# Patient Record
Sex: Female | Born: 1955 | Race: Black or African American | Hispanic: No | Smoking: Current every day smoker
Health system: Southern US, Community
[De-identification: ages and names within clinical notes are randomized; demographics above are authoritative.]

## PROBLEM LIST (undated history)

## (undated) DIAGNOSIS — F329 Major depressive disorder, single episode, unspecified: Secondary | ICD-10-CM

## (undated) DIAGNOSIS — K219 Gastro-esophageal reflux disease without esophagitis: Secondary | ICD-10-CM

## (undated) DIAGNOSIS — F32A Depression, unspecified: Secondary | ICD-10-CM

## (undated) DIAGNOSIS — M1990 Unspecified osteoarthritis, unspecified site: Secondary | ICD-10-CM

## (undated) DIAGNOSIS — E785 Hyperlipidemia, unspecified: Secondary | ICD-10-CM

## (undated) DIAGNOSIS — I10 Essential (primary) hypertension: Secondary | ICD-10-CM

## (undated) DIAGNOSIS — F419 Anxiety disorder, unspecified: Secondary | ICD-10-CM

## (undated) DIAGNOSIS — E119 Type 2 diabetes mellitus without complications: Secondary | ICD-10-CM

## (undated) HISTORY — DX: Anxiety disorder, unspecified: F41.9

## (undated) HISTORY — DX: Essential (primary) hypertension: I10

## (undated) HISTORY — DX: Major depressive disorder, single episode, unspecified: F32.9

## (undated) HISTORY — DX: Hyperlipidemia, unspecified: E78.5

## (undated) HISTORY — DX: Unspecified osteoarthritis, unspecified site: M19.90

## (undated) HISTORY — PX: SKIN GRAFT FULL THICKNESS ARM: SUR1297

## (undated) HISTORY — DX: Depression, unspecified: F32.A

## (undated) HISTORY — DX: Type 2 diabetes mellitus without complications: E11.9

## (undated) HISTORY — DX: Gastro-esophageal reflux disease without esophagitis: K21.9

---

## 2009-02-20 ENCOUNTER — Emergency Department (HOSPITAL_COMMUNITY): Admission: EM | Admit: 2009-02-20 | Discharge: 2009-02-20 | Payer: Self-pay | Admitting: Emergency Medicine

## 2010-04-11 LAB — GLUCOSE, CAPILLARY: Glucose-Capillary: 122 mg/dL — ABNORMAL HIGH (ref 70–99)

## 2015-06-04 LAB — BASIC METABOLIC PANEL: GLUCOSE: 125 mg/dL

## 2016-02-18 DIAGNOSIS — Z23 Encounter for immunization: Secondary | ICD-10-CM | POA: Diagnosis not present

## 2016-04-28 DIAGNOSIS — Z124 Encounter for screening for malignant neoplasm of cervix: Secondary | ICD-10-CM | POA: Diagnosis not present

## 2016-04-28 DIAGNOSIS — Z1231 Encounter for screening mammogram for malignant neoplasm of breast: Secondary | ICD-10-CM | POA: Diagnosis not present

## 2016-04-28 DIAGNOSIS — Z1211 Encounter for screening for malignant neoplasm of colon: Secondary | ICD-10-CM | POA: Diagnosis not present

## 2016-04-28 DIAGNOSIS — E559 Vitamin D deficiency, unspecified: Secondary | ICD-10-CM | POA: Diagnosis not present

## 2016-04-28 DIAGNOSIS — Z6826 Body mass index (BMI) 26.0-26.9, adult: Secondary | ICD-10-CM | POA: Diagnosis not present

## 2016-04-28 DIAGNOSIS — E1165 Type 2 diabetes mellitus with hyperglycemia: Secondary | ICD-10-CM | POA: Diagnosis not present

## 2016-04-28 DIAGNOSIS — R5382 Chronic fatigue, unspecified: Secondary | ICD-10-CM | POA: Diagnosis not present

## 2016-04-28 DIAGNOSIS — I10 Essential (primary) hypertension: Secondary | ICD-10-CM | POA: Diagnosis not present

## 2016-04-28 DIAGNOSIS — Z0000 Encounter for general adult medical examination without abnormal findings: Secondary | ICD-10-CM | POA: Diagnosis not present

## 2016-04-28 DIAGNOSIS — E7801 Familial hypercholesterolemia: Secondary | ICD-10-CM | POA: Diagnosis not present

## 2016-05-26 DIAGNOSIS — M5416 Radiculopathy, lumbar region: Secondary | ICD-10-CM | POA: Diagnosis not present

## 2016-05-26 DIAGNOSIS — M1990 Unspecified osteoarthritis, unspecified site: Secondary | ICD-10-CM | POA: Diagnosis not present

## 2016-05-26 DIAGNOSIS — M5136 Other intervertebral disc degeneration, lumbar region: Secondary | ICD-10-CM | POA: Diagnosis not present

## 2016-05-26 DIAGNOSIS — I999 Unspecified disorder of circulatory system: Secondary | ICD-10-CM | POA: Diagnosis not present

## 2016-09-08 ENCOUNTER — Encounter: Payer: Self-pay | Admitting: Family Medicine

## 2016-09-08 ENCOUNTER — Ambulatory Visit (INDEPENDENT_AMBULATORY_CARE_PROVIDER_SITE_OTHER): Payer: Medicare Other | Admitting: Family Medicine

## 2016-09-08 VITALS — BP 134/78 | HR 88 | Temp 96.2°F | Resp 18 | Ht 63.0 in | Wt 196.0 lb

## 2016-09-08 DIAGNOSIS — E119 Type 2 diabetes mellitus without complications: Secondary | ICD-10-CM

## 2016-09-08 DIAGNOSIS — I10 Essential (primary) hypertension: Secondary | ICD-10-CM

## 2016-09-08 DIAGNOSIS — E559 Vitamin D deficiency, unspecified: Secondary | ICD-10-CM

## 2016-09-08 DIAGNOSIS — E785 Hyperlipidemia, unspecified: Secondary | ICD-10-CM | POA: Diagnosis not present

## 2016-09-08 DIAGNOSIS — G4700 Insomnia, unspecified: Secondary | ICD-10-CM | POA: Diagnosis not present

## 2016-09-08 DIAGNOSIS — M549 Dorsalgia, unspecified: Secondary | ICD-10-CM

## 2016-09-08 DIAGNOSIS — G8929 Other chronic pain: Secondary | ICD-10-CM | POA: Diagnosis not present

## 2016-09-08 DIAGNOSIS — M545 Low back pain: Secondary | ICD-10-CM

## 2016-09-08 DIAGNOSIS — F418 Other specified anxiety disorders: Secondary | ICD-10-CM

## 2016-09-08 DIAGNOSIS — K219 Gastro-esophageal reflux disease without esophagitis: Secondary | ICD-10-CM

## 2016-09-08 DIAGNOSIS — Z9109 Other allergy status, other than to drugs and biological substances: Secondary | ICD-10-CM | POA: Diagnosis not present

## 2016-09-08 LAB — CBC
HCT: 42.3 % (ref 35.0–45.0)
Hemoglobin: 13.8 g/dL (ref 11.7–15.5)
MCH: 31 pg (ref 27.0–33.0)
MCHC: 32.6 g/dL (ref 32.0–36.0)
MCV: 95.1 fL (ref 80.0–100.0)
MPV: 11.4 fL (ref 7.5–12.5)
Platelets: 222 10*3/uL (ref 140–400)
RBC: 4.45 MIL/uL (ref 3.80–5.10)
RDW: 13.1 % (ref 11.0–15.0)
WBC: 4.2 10*3/uL (ref 3.8–10.8)

## 2016-09-08 NOTE — Patient Instructions (Addendum)
Need records Dr Loney Hering Need blood work See me in one month for PE Try to take a walk every day that you are able Need an eye exam
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
## 2016-09-09 LAB — COMPLETE METABOLIC PANEL WITH GFR
ALT: 25 U/L (ref 6–29)
AST: 27 U/L (ref 10–35)
Albumin: 4.1 g/dL (ref 3.6–5.1)
Alkaline Phosphatase: 95 U/L (ref 33–130)
BUN: 15 mg/dL (ref 7–25)
CALCIUM: 9.6 mg/dL (ref 8.6–10.4)
CHLORIDE: 111 mmol/L — AB (ref 98–110)
CO2: 21 mmol/L (ref 20–32)
Creat: 0.93 mg/dL (ref 0.50–0.99)
GFR, EST AFRICAN AMERICAN: 76 mL/min (ref >=60)
GFR, EST NON AFRICAN AMERICAN: 66 mL/min (ref >=60)
Glucose, Bld: 102 mg/dL — ABNORMAL HIGH (ref 65–99)
POTASSIUM: 4.5 mmol/L (ref 3.5–5.3)
Sodium: 141 mmol/L (ref 135–146)
Total Bilirubin: 0.3 mg/dL (ref 0.2–1.2)
Total Protein: 6.6 g/dL (ref 6.1–8.1)

## 2016-09-09 LAB — MICROALBUMIN / CREATININE URINE RATIO
Creatinine, Urine: 166 mg/dL (ref 20–320)
Microalb Creat Ratio: 7 ug/mg{creat} (ref <30)
Microalb, Ur: 1.1 mg/dL

## 2016-09-09 LAB — LIPID PANEL
Cholesterol: 207 mg/dL — ABNORMAL HIGH (ref <200)
HDL: 57 mg/dL (ref >50)
LDL Cholesterol: 114 mg/dL — ABNORMAL HIGH (ref <100)
TRIGLYCERIDES: 179 mg/dL — AB (ref <150)
Total CHOL/HDL Ratio: 3.6 Ratio (ref <5.0)
VLDL: 36 mg/dL — ABNORMAL HIGH (ref <30)

## 2016-09-09 LAB — URINALYSIS, MICROSCOPIC ONLY
Bacteria, UA: NONE SEEN [HPF]
Casts: NONE SEEN [LPF]
Crystals: NONE SEEN [HPF]
Yeast: NONE SEEN [HPF]

## 2016-09-09 LAB — HEMOGLOBIN A1C
Hgb A1c MFr Bld: 5.9 % — ABNORMAL HIGH (ref <5.7)
Mean Plasma Glucose: 123 mg/dL

## 2016-09-09 LAB — URINALYSIS, ROUTINE W REFLEX MICROSCOPIC
Bilirubin Urine: NEGATIVE
Glucose, UA: NEGATIVE
Hgb urine dipstick: NEGATIVE
Ketones, ur: NEGATIVE
Nitrite: NEGATIVE
Protein, ur: NEGATIVE
Specific Gravity, Urine: 1.021 (ref 1.001–1.035)
pH: 6 (ref 5.0–8.0)

## 2016-09-09 LAB — VITAMIN D 25 HYDROXY (VIT D DEFICIENCY, FRACTURES): Vit D, 25-Hydroxy: 28 ng/mL — ABNORMAL LOW (ref 30–100)

## 2016-09-18 ENCOUNTER — Encounter: Payer: Self-pay | Admitting: Family Medicine

## 2016-09-18 MED ORDER — SIMVASTATIN 40 MG PO TABS
40.0000 mg | ORAL_TABLET | Freq: Every day | ORAL | 3 refills | Status: DC
Start: 2016-09-18 — End: 2016-10-10

## 2016-10-10 ENCOUNTER — Ambulatory Visit (INDEPENDENT_AMBULATORY_CARE_PROVIDER_SITE_OTHER): Payer: Medicare Other | Admitting: Family Medicine

## 2016-10-10 ENCOUNTER — Encounter: Payer: Self-pay | Admitting: Family Medicine

## 2016-10-10 VITALS — BP 138/86 | HR 84 | Temp 98.1°F | Resp 18 | Ht 63.0 in | Wt 192.1 lb

## 2016-10-10 DIAGNOSIS — Z1159 Encounter for screening for other viral diseases: Secondary | ICD-10-CM | POA: Diagnosis not present

## 2016-10-10 DIAGNOSIS — Z23 Encounter for immunization: Secondary | ICD-10-CM | POA: Diagnosis not present

## 2016-10-10 DIAGNOSIS — E785 Hyperlipidemia, unspecified: Secondary | ICD-10-CM | POA: Diagnosis not present

## 2016-10-10 DIAGNOSIS — Z1231 Encounter for screening mammogram for malignant neoplasm of breast: Secondary | ICD-10-CM | POA: Diagnosis not present

## 2016-10-10 DIAGNOSIS — M545 Low back pain: Secondary | ICD-10-CM

## 2016-10-10 DIAGNOSIS — G8929 Other chronic pain: Secondary | ICD-10-CM

## 2016-10-10 DIAGNOSIS — E119 Type 2 diabetes mellitus without complications: Secondary | ICD-10-CM | POA: Diagnosis not present

## 2016-10-10 DIAGNOSIS — I10 Essential (primary) hypertension: Secondary | ICD-10-CM

## 2016-10-10 DIAGNOSIS — Z1211 Encounter for screening for malignant neoplasm of colon: Secondary | ICD-10-CM

## 2016-10-10 DIAGNOSIS — Z0000 Encounter for general adult medical examination without abnormal findings: Secondary | ICD-10-CM

## 2016-10-10 DIAGNOSIS — Z1239 Encounter for other screening for malignant neoplasm of breast: Secondary | ICD-10-CM

## 2016-10-10 MED ORDER — LISINOPRIL 5 MG PO TABS: 5.0000 mg | ORAL_TABLET | Freq: Every day | ORAL | 11 refills | Status: DC

## 2016-10-10 MED ORDER — METFORMIN HCL 500 MG PO TABS: 500.0000 mg | ORAL_TABLET | Freq: Two times a day (BID) | ORAL | 11 refills | Status: AC

## 2016-10-10 MED ORDER — SIMVASTATIN 40 MG PO TABS: 40.0000 mg | ORAL_TABLET | Freq: Every day | ORAL | 11 refills | Status: AC

## 2016-10-10 MED ORDER — BUSPIRONE HCL 5 MG PO TABS: 5.0000 mg | ORAL_TABLET | Freq: Three times a day (TID) | ORAL | 11 refills | Status: AC

## 2016-10-10 MED ORDER — RANITIDINE HCL 150 MG PO TABS: 150.0000 mg | ORAL_TABLET | Freq: Two times a day (BID) | ORAL | 11 refills | Status: DC

## 2016-10-10 MED ORDER — VENLAFAXINE HCL ER 75 MG PO CP24: 75.0000 mg | ORAL_CAPSULE | Freq: Every day | ORAL | 11 refills | Status: AC

## 2016-10-10 MED ORDER — AMITRIPTYLINE HCL 25 MG PO TABS: 25.0000 mg | ORAL_TABLET | Freq: Every day | ORAL | 11 refills | Status: DC

## 2016-10-10 MED ORDER — ASPIRIN EC 81 MG PO TBEC: 81.0000 mg | DELAYED_RELEASE_TABLET | Freq: Every day | ORAL | 11 refills | Status: AC

## 2016-10-10 MED ORDER — GABAPENTIN 300 MG PO CAPS
300.0000 mg | ORAL_CAPSULE | Freq: Three times a day (TID) | ORAL | 11 refills | Status: DC
Start: 2016-10-10 — End: 2018-03-15

## 2016-10-10 MED ORDER — NAPROXEN 500 MG PO TABS: 500.0000 mg | ORAL_TABLET | Freq: Two times a day (BID) | ORAL | 11 refills | Status: DC

## 2016-10-10 MED ORDER — CETIRIZINE HCL 10 MG PO TABS: 10.0000 mg | ORAL_TABLET | Freq: Every day | ORAL | 11 refills | Status: AC

## 2016-10-10 NOTE — Patient Instructions (Signed)
Need mammogram Need back x ray Need colon cancer screening - referred to gastroenterology Medicines refilled Stay as active as you can manage Get blood work I will notify you of your test results  See me every 3 months

## 2016-10-10 NOTE — Progress Notes (Signed)
Chief Complaint  Patient presents with  . Annual Exam   Here for PE Never had mammogram Never had colonoscopy Last PAP one year BP controlled DM controlled Flu shot today Needs diabetes eye exam  Patient Active Problem List   Diagnosis Date Noted  . Controlled type 2 diabetes mellitus without complication, without long-term current use of insulin (HCC) 09/08/2016  . HLD (hyperlipidemia) 09/08/2016  . Essential hypertension 09/08/2016  . Environmental allergies 09/08/2016  . GERD without esophagitis 09/08/2016  . Chronic back pain 09/08/2016  . Depression with anxiety 09/08/2016  . Insomnia 09/08/2016    Outpatient Encounter Prescriptions as of 10/10/2016  Medication Sig  . amitriptyline (ELAVIL) 25 MG tablet Take 1 tablet (25 mg total) by mouth at bedtime.  Marland Kitchen aspirin EC 81 MG tablet Take 1 tablet (81 mg total) by mouth daily.  . busPIRone (BUSPAR) 5 MG tablet Take 1 tablet (5 mg total) by mouth 3 (three) times daily.  . cetirizine (ZYRTEC) 10 MG tablet Take 1 tablet (10 mg total) by mouth daily.  Marland Kitchen gabapentin (NEURONTIN) 300 MG capsule Take 1 capsule (300 mg total) by mouth 3 (three) times daily.  Marland Kitchen lisinopril (PRINIVIL,ZESTRIL) 5 MG tablet Take 1 tablet (5 mg total) by mouth daily.  . metFORMIN (GLUCOPHAGE) 500 MG tablet Take 1 tablet (500 mg total) by mouth 2 (two) times daily with a meal.  . naproxen (NAPROSYN) 500 MG tablet Take 1 tablet (500 mg total) by mouth 2 (two) times daily with a meal.  . ranitidine (ZANTAC) 150 MG tablet Take 1 tablet (150 mg total) by mouth 2 (two) times daily.  . simvastatin (ZOCOR) 40 MG tablet Take 1 tablet (40 mg total) by mouth daily.  Marland Kitchen venlafaxine XR (EFFEXOR-XR) 75 MG 24 hr capsule Take 1 capsule (75 mg total) by mouth daily with breakfast.   No facility-administered encounter medications on file as of 10/10/2016.     No Known Allergies  Review of Systems  Constitutional: Negative for activity change, appetite change and  unexpected weight change.  HENT: Negative for congestion, dental problem, postnasal drip and rhinorrhea.   Eyes: Negative for redness and visual disturbance.  Respiratory: Negative for cough and shortness of breath.   Cardiovascular: Negative for chest pain, palpitations and leg swelling.  Gastrointestinal: Negative for abdominal pain, constipation and diarrhea.  Genitourinary: Negative for difficulty urinating, frequency and menstrual problem.  Musculoskeletal: Positive for back pain. Negative for arthralgias.       Back and both legs  Neurological: Negative for dizziness and headaches.  Psychiatric/Behavioral: Negative for dysphoric mood and sleep disturbance. The patient is not nervous/anxious.     BP 138/86 (BP Location: Right Arm, Patient Position: Supine)   Pulse 84   Temp 98.1 F (36.7 C) (Temporal)   Resp 18   Ht  (1.6 m)   Wt 192 lb 1.9 oz (87.1 kg)   SpO2 96%   BMI 34.03 kg/m   Physical Exam  BP 138/86 (BP Location: Right Arm, Patient Position: Supine)   Pulse 84   Temp 98.1 F (36.7 C) (Temporal)   Resp 18   Ht  (1.6 m)   Wt 192 lb 1.9 oz (87.1 kg)   SpO2 96%   BMI 34.03 kg/m   General Appearance:    Alert, cooperative, no distress, appears stated age  Head:    Normocephalic, without obvious abnormality, atraumatic  Eyes:    PERRL, conjunctiva/corneas clear, EOM's intact, fundi    benign, both  eyes  Ears:    Normal TM's and external ear canals, both ears  Nose:   Nares normal, septum midline, mucosa normal, no drainage    or sinus tenderness  Throat:   Lips, mucosa, and tongue normal; teeth mostly absent or fractured at gum line   Neck:   Supple, symmetrical, trachea midline, no adenopathy;    thyroid:  no enlargement/tenderness/nodules; no carotid   bruit  Back:     Symmetric, no curvature, ROM normal, no tenderness  Lungs:     Clear to auscultation bilaterally, respirations unlabored  Chest Wall:    No tenderness or deformity   Heart:     Regular rate and rhythm, S1 and S2 normal, no murmur, rub   or gallop  Breast Exam:    No tenderness, masses, or nipple abnormality.  Axilla clear  Abdomen:     Soft, non-tender, bowel sounds active all four quadrants,    no masses, no organomegaly  Genitalia:    Normal female without lesion, discharge or tenderness.  Strong odor  Extremities:   Extremities normal, atraumatic, no cyanosis or edema  Pulses:   2+ and symmetric all extremities  Skin:   Skin color, texture, turgor normal, no rashes or lesions.  Thickened dystrophic toenails bilat  Lymph nodes:   Cervical, supraclavicular, and axillary nodes normal  Neurologic:   Normal strength, sensation and reflexes    throughout     ASSESSMENT/PLAN:  1. Need for influenza vaccination - Flu Vaccine QUAD 36+ mos IM  2. Screen for colon cancer - Ambulatory referral to Gastroenterology  3. Encounter for hepatitis C screening test for low risk patient - Hepatitis C antibody  4. Chronic midline low back pain without sciatica  5. Controlled type 2 diabetes mellitus without complication, without long-term current use of insulin (HCC) - CBC - COMPLETE METABOLIC PANEL WITH GFR - Hemoglobin A1c - Lipid panel - Microalbumin / creatinine urine ratio - Urinalysis, Routine w reflex microscopic - DG Lumbar Spine Complete; Future - Ambulatory referral to Ophthalmology  6. Hyperlipidemia, unspecified hyperlipidemia type  7. Essential hypertension  8. Screening for breast cancer - MM Digital Screening; Future   Patient Instructions  Need mammogram Need back x ray Need colon cancer screening - referred to gastroenterology Medicines refilled Stay as active as you can manage Get blood work I will notify you of your test results  See me every 3 months   Eustace Moore, MD

## 2016-10-17 ENCOUNTER — Telehealth: Payer: Self-pay | Admitting: Family Medicine

## 2016-10-17 NOTE — Telephone Encounter (Signed)
Left message informing patient that the lumbar xray wont do the legs but the pain could be coming from the back and radiating to the legs that is why the xray is of the lumbar spine.

## 2016-10-17 NOTE — Telephone Encounter (Signed)
Patient states she is scheduled for a lumber xray @ [facility].  She would like to know if the xrays will include both legs. If they are not included, she is asking if this could be added.   cb [phone] [id number]

## 2016-10-18 ENCOUNTER — Telehealth: Payer: Self-pay | Admitting: Family Medicine

## 2016-10-18 NOTE — Telephone Encounter (Signed)
Patients states that her arms are bothering her and would like to know if xrays of the arms can be done when the back xrays are done tomorrow @ [facility].

## 2016-10-19 ENCOUNTER — Ambulatory Visit (HOSPITAL_COMMUNITY): Payer: Medicare Other

## 2016-10-19 NOTE — Telephone Encounter (Signed)
Patient informed of message below, verbalized understanding.  

## 2016-10-19 NOTE — Telephone Encounter (Signed)
Unfortunately, that requires another visit and evaluation.  Cannot x ray "arms"

## 2016-10-26 ENCOUNTER — Ambulatory Visit (HOSPITAL_COMMUNITY): Payer: Medicare Other

## 2016-11-02 ENCOUNTER — Ambulatory Visit (HOSPITAL_COMMUNITY): Payer: Medicare Other

## 2016-11-14 ENCOUNTER — Other Ambulatory Visit: Payer: Self-pay | Admitting: Family Medicine

## 2016-11-14 NOTE — Telephone Encounter (Signed)
Seen [phone] 

## 2016-12-05 ENCOUNTER — Other Ambulatory Visit: Payer: Self-pay | Admitting: Family Medicine

## 2016-12-06 ENCOUNTER — Other Ambulatory Visit: Payer: Self-pay | Admitting: Family Medicine

## 2017-01-04 ENCOUNTER — Ambulatory Visit (INDEPENDENT_AMBULATORY_CARE_PROVIDER_SITE_OTHER): Payer: Medicare Other | Admitting: Family Medicine

## 2017-01-04 ENCOUNTER — Other Ambulatory Visit: Payer: Self-pay

## 2017-01-04 ENCOUNTER — Encounter: Payer: Self-pay | Admitting: Family Medicine

## 2017-01-04 VITALS — BP 162/100 | HR 88 | Temp 98.2°F | Resp 18 | Ht 63.0 in | Wt 198.0 lb

## 2017-01-04 DIAGNOSIS — I10 Essential (primary) hypertension: Secondary | ICD-10-CM

## 2017-01-04 DIAGNOSIS — E785 Hyperlipidemia, unspecified: Secondary | ICD-10-CM

## 2017-01-04 DIAGNOSIS — E119 Type 2 diabetes mellitus without complications: Secondary | ICD-10-CM

## 2017-01-04 DIAGNOSIS — M545 Low back pain: Secondary | ICD-10-CM | POA: Diagnosis not present

## 2017-01-04 DIAGNOSIS — G8929 Other chronic pain: Secondary | ICD-10-CM

## 2017-01-04 MED ORDER — LISINOPRIL 10 MG PO TABS: 10.0000 mg | ORAL_TABLET | Freq: Every day | ORAL | 3 refills | Status: DC

## 2017-01-04 MED ORDER — BLOOD GLUCOSE MONITOR KIT: PACK | 0 refills | Status: DC

## 2017-01-04 NOTE — Patient Instructions (Addendum)
You can go get the back x ray next week Due for blood work today  Need to increase the lisinopril to 10 mg a day I have sent a new prescription to [facility]  See me in 2 weeks

## 2017-01-04 NOTE — Progress Notes (Signed)
Chief Complaint  Patient presents with  . Follow-up    3 month   Patient is here for follow-up.  She has not had the testing that was recommended to order for her since her last visit.  She states that she had a sick family member and has been out of town for the last 3 months. She is overdue for mammogram, Pap smear, colonoscopy, foot exam, diabetic eye exam, and immunizations.  She did get her flu shot today. I reviewed the notes from her old primary care doctor, and was able to abstract due to pneumonia shots.  Other than this the results were noncontributory. She continues to complain of low back pain.  This is been a complaint for many months, even in her old family practice doctor's note.  She states that it sometimes radiates into both legs.  She is requesting an x-ray.  This was ordered for her in September not obtained.  It is reordered for her today.  She states that she takes Tylenol but is not helpful for her.  She has no bowel or bladder complaints.  No numbness or weakness.  No history of back injury or surgery. She has diabetes.  She states her sugars are well controlled.  She states she needs a new monitor.  Her sugar this morning was 160.  Her last hemoglobin A1c was under 6.  We will repeat lab work for her today. She is compliant with her blood pressure medication.  She takes lisinopril 5 mg a day.  In spite of this her blood pressure is quite elevated today.  I am going to increase her lisinopril to 10 mg a day and will recheck it in a couple weeks. She is compliant with her statin.  Her last LDL was elevated at 114.  If it is elevated again today with current blood work I will increase her statin.  No history of cardiovascular disease, heart disease, PVD.  She does not smoke.  Patient Active Problem List   Diagnosis Date Noted  . Controlled type 2 diabetes mellitus without complication, without long-term current use of insulin ([place]) 09/08/2016  . HLD (hyperlipidemia)  09/08/2016  . Essential hypertension 09/08/2016  . Environmental allergies 09/08/2016  . GERD without esophagitis 09/08/2016  . Chronic back pain 09/08/2016  . Depression with anxiety 09/08/2016  . Insomnia 09/08/2016    Outpatient Encounter Medications as of 01/04/2017  Medication Sig  . amitriptyline (ELAVIL) 25 MG tablet Take 1 tablet (25 mg total) by mouth at bedtime.  Marland Kitchen aspirin EC 81 MG tablet Take 1 tablet (81 mg total) by mouth daily.  . busPIRone (BUSPAR) 5 MG tablet Take 1 tablet (5 mg total) by mouth 3 (three) times daily.  . cetirizine (ZYRTEC) 10 MG tablet Take 1 tablet (10 mg total) by mouth daily.  Marland Kitchen gabapentin (NEURONTIN) 300 MG capsule Take 1 capsule (300 mg total) by mouth 3 (three) times daily.  . metFORMIN (GLUCOPHAGE) 500 MG tablet Take 1 tablet (500 mg total) by mouth 2 (two) times daily with a meal.  . naproxen (NAPROSYN) 500 MG tablet Take 1 tablet (500 mg total) by mouth 2 (two) times daily with a meal.  . ranitidine (ZANTAC) 150 MG tablet TAKE 1 TABLET BY MOUTH TWICE DAILY.  . simvastatin (ZOCOR) 40 MG tablet Take 1 tablet (40 mg total) by mouth daily.  Marland Kitchen venlafaxine XR (EFFEXOR-XR) 75 MG 24 hr capsule Take 1 capsule (75 mg total) by mouth daily with breakfast.  .  blood glucose meter kit and supplies KIT Dispense based on patient and insurance preference.  Marland Kitchen lisinopril (PRINIVIL,ZESTRIL) 10 MG tablet Take 1 tablet (10 mg total) by mouth daily.   No facility-administered encounter medications on file as of 01/04/2017.     No Known Allergies  Review of Systems  Constitutional: Negative for activity change, appetite change and unexpected weight change.  HENT: Positive for dental problem. Negative for congestion, postnasal drip and rhinorrhea.        No dental care, dental fractures  Eyes: Positive for visual disturbance. Negative for redness.       Overdue for eye exam  Respiratory: Negative for cough and shortness of breath.   Cardiovascular: Negative for  chest pain, palpitations and leg swelling.  Gastrointestinal: Negative for abdominal pain, constipation and diarrhea.  Genitourinary: Negative for difficulty urinating, frequency and menstrual problem.  Musculoskeletal: Positive for back pain. Negative for arthralgias.       Back and both legs  Neurological: Negative for dizziness and headaches.  Psychiatric/Behavioral: Negative for dysphoric mood and sleep disturbance. The patient is not nervous/anxious.     BP (!) 162/100   Pulse 88   Temp 98.2 F (36.8 C) (Temporal)   Resp 18   Ht _0  (1.6 m)   Wt 198 lb 0.6 oz (89.8 kg)   SpO2 99%   BMI 35.08 kg/m   Physical Exam  Constitutional: She is oriented to person, place, and time. She appears well-developed and well-nourished.  HENT:  Head: Normocephalic and atraumatic.  Mouth/Throat: Oropharynx is clear and moist.  Eyes: Conjunctivae are normal. Pupils are equal, round, and reactive to light.  Neck: Normal range of motion. No thyromegaly present.  Cardiovascular: Normal rate, regular rhythm and normal heart sounds.  Pulmonary/Chest: Effort normal and breath sounds normal.  Lymphadenopathy:    She has no cervical adenopathy.  Neurological: She is alert and oriented to person, place, and time. She displays normal reflexes.  Psychiatric: She has a normal mood and affect. Her behavior is normal.    ASSESSMENT/PLAN:  1. Chronic midline low back pain without sciatica - DG Lumbar Spine Complete; Future  2. Controlled type 2 diabetes mellitus without complication, without long-term current use of insulin ([place]) - Ambulatory referral to Podiatry  3. Hyperlipidemia, unspecified hyperlipidemia type  4. Essential hypertension Not well controlled.  We will double her lisinopril and follow.   Patient Instructions  You can go get the back x ray next week Due for blood work today  Need to increase the lisinopril to 10 mg a day I have sent a new prescription to [facility]  See me in  2 weeks    Raylene Everts, MD

## 2017-01-05 ENCOUNTER — Ambulatory Visit: Payer: Medicare Other | Admitting: Family Medicine

## 2017-01-08 ENCOUNTER — Ambulatory Visit (HOSPITAL_COMMUNITY)
Admission: RE | Admit: 2017-01-08 | Discharge: 2017-01-08 | Disposition: A | Discharge status: Home / Self Care | Payer: Medicare Other | Source: Ambulatory Visit | Attending: Family Medicine | Admitting: Family Medicine

## 2017-01-08 DIAGNOSIS — M4316 Spondylolisthesis, lumbar region: Secondary | ICD-10-CM | POA: Insufficient documentation

## 2017-01-08 DIAGNOSIS — M545 Low back pain: Secondary | ICD-10-CM | POA: Insufficient documentation

## 2017-01-08 DIAGNOSIS — M5136 Other intervertebral disc degeneration, lumbar region: Secondary | ICD-10-CM | POA: Diagnosis not present

## 2017-01-08 DIAGNOSIS — G8929 Other chronic pain: Secondary | ICD-10-CM | POA: Diagnosis not present

## 2017-01-09 ENCOUNTER — Ambulatory Visit: Payer: Medicare Other | Admitting: Family Medicine

## 2017-01-18 ENCOUNTER — Encounter: Payer: Self-pay | Admitting: Family Medicine

## 2017-01-22 ENCOUNTER — Other Ambulatory Visit: Payer: Self-pay

## 2017-01-22 ENCOUNTER — Telehealth: Payer: Self-pay | Admitting: Family Medicine

## 2017-01-22 ENCOUNTER — Encounter: Payer: Self-pay | Admitting: Family Medicine

## 2017-01-22 ENCOUNTER — Ambulatory Visit (INDEPENDENT_AMBULATORY_CARE_PROVIDER_SITE_OTHER): Payer: Medicare Other | Admitting: Family Medicine

## 2017-01-22 VITALS — BP 164/110 | HR 88 | Temp 97.8°F | Resp 18 | Ht 63.0 in | Wt 199.0 lb

## 2017-01-22 DIAGNOSIS — E119 Type 2 diabetes mellitus without complications: Secondary | ICD-10-CM | POA: Diagnosis not present

## 2017-01-22 DIAGNOSIS — I10 Essential (primary) hypertension: Secondary | ICD-10-CM

## 2017-01-22 DIAGNOSIS — M545 Low back pain, unspecified: Secondary | ICD-10-CM

## 2017-01-22 DIAGNOSIS — G8929 Other chronic pain: Secondary | ICD-10-CM | POA: Diagnosis not present

## 2017-01-22 NOTE — Patient Instructions (Addendum)
Take the medicines every day I am referring you to a orthopedic for the back pain Walk every day that you are able Do back exercises twice a day See me in a month The next appt should be in the afternoon Need referral to the eye doctor for DIABETIC EYE EXAM

## 2017-01-22 NOTE — Telephone Encounter (Signed)
Wants to know if you was calling in some more medicine, please call her [phone]

## 2017-01-22 NOTE — Progress Notes (Signed)
Chief Complaint  Patient presents with  . Follow-up    2 week   Patient is here for routine follow-up.  She continues to complain of daily back pain.  She wants to see a back specialist.  I will refer her to orthopedic.  I discussed with her that she needs to lose weight.  I discussed with her she needs to walk daily.  I have given her back exercises to do.  She can take over-the-counter Advil or Aleve for pain.  She feels like her back is not getting better.  I did do an x-ray of her back.  Other than multilevel degenerative disc changes that are mild and some arthritic change, nothing appears significant.  Medicaid will not pay for physical therapy.  We will get a back specialist to see if there are other recommendations. She also is here for a blood pressure check.  At her last visit I increased her lisinopril.  Unfortunately she is still taking the old dose and even more unfortunately, she did not take her medicine this morning.  Her blood pressure is not well controlled.  I discussed with her the appropriate doses of medicines.  I checked her blister packs.  I advised her to come back for follow-up in the afternoon when she is appropriately taken her medications. We discussed again that she is overdue for a eye exam.  She states insurance does not pay for it.  I will place a referral for a diabetic eye exam.  Patient Active Problem List   Diagnosis Date Noted  . Controlled type 2 diabetes mellitus without complication, without long-term current use of insulin ([place]) 09/08/2016  . HLD (hyperlipidemia) 09/08/2016  . Essential hypertension 09/08/2016  . Environmental allergies 09/08/2016  . GERD without esophagitis 09/08/2016  . Chronic back pain 09/08/2016  . Depression with anxiety 09/08/2016  . Insomnia 09/08/2016    Outpatient Encounter Medications as of 01/22/2017  Medication Sig  . amitriptyline (ELAVIL) 25 MG tablet Take 1 tablet (25 mg total) by mouth at bedtime.  Marland Kitchen aspirin EC  81 MG tablet Take 1 tablet (81 mg total) by mouth daily.  . blood glucose meter kit and supplies KIT Dispense based on patient and insurance preference.  . busPIRone (BUSPAR) 5 MG tablet Take 1 tablet (5 mg total) by mouth 3 (three) times daily.  . cetirizine (ZYRTEC) 10 MG tablet Take 1 tablet (10 mg total) by mouth daily.  Marland Kitchen gabapentin (NEURONTIN) 300 MG capsule Take 1 capsule (300 mg total) by mouth 3 (three) times daily.  Marland Kitchen lisinopril (PRINIVIL,ZESTRIL) 10 MG tablet Take 1 tablet (10 mg total) by mouth daily.  . metFORMIN (GLUCOPHAGE) 500 MG tablet Take 1 tablet (500 mg total) by mouth 2 (two) times daily with a meal.  . naproxen (NAPROSYN) 500 MG tablet Take 1 tablet (500 mg total) by mouth 2 (two) times daily with a meal.  . ranitidine (ZANTAC) 150 MG tablet TAKE 1 TABLET BY MOUTH TWICE DAILY.  . simvastatin (ZOCOR) 40 MG tablet Take 1 tablet (40 mg total) by mouth daily.  Marland Kitchen venlafaxine XR (EFFEXOR-XR) 75 MG 24 hr capsule Take 1 capsule (75 mg total) by mouth daily with breakfast.   No facility-administered encounter medications on file as of 01/22/2017.     No Known Allergies  Review of Systems  Constitutional: Negative for activity change, appetite change and unexpected weight change.  HENT: Positive for dental problem. Negative for congestion, postnasal drip and rhinorrhea.  No dental care, dental fractures  Eyes: Positive for visual disturbance. Negative for redness.       Overdue for eye exam  Respiratory: Negative for cough and shortness of breath.   Cardiovascular: Negative for chest pain, palpitations and leg swelling.  Gastrointestinal: Negative for abdominal pain, constipation and diarrhea.  Genitourinary: Negative for difficulty urinating, frequency and menstrual problem.  Musculoskeletal: Positive for back pain. Negative for arthralgias.       Back pain chronic  Neurological: Negative for dizziness and headaches.  Psychiatric/Behavioral: Negative for dysphoric  mood and sleep disturbance. The patient is not nervous/anxious.     BP (!) 164/110 (BP Location: Left Arm, Patient Position: Sitting, Cuff Size: Normal)   Pulse 88   Temp 97.8 F (36.6 C) (Temporal)   Resp 18   Ht 5' 3"  (1.6 m)   Wt 199 lb 0.6 oz (90.3 kg)   SpO2 97%   BMI 35.26 kg/m   Physical Exam  Constitutional: She is oriented to person, place, and time. She appears well-developed and well-nourished.  HENT:  Head: Normocephalic and atraumatic.  Mouth/Throat: Oropharynx is clear and moist.  Eyes: Conjunctivae are normal. Pupils are equal, round, and reactive to light.  Neck: Normal range of motion.  Cardiovascular: Normal rate, regular rhythm and normal heart sounds.  Pulmonary/Chest: Effort normal and breath sounds normal.  Musculoskeletal: Normal range of motion.  Back appears straight and symmetric.  No tenderness over the lumbar muscles.  Mild tenderness centrally over L4-5.  No SI tenderness.  Strength sensation range of motion and reflexes are symmetric in both lower extremities with no focal neurologic findings  Neurological: She is alert and oriented to person, place, and time. She displays normal reflexes.  Psychiatric: She has a normal mood and affect. Her behavior is normal.    ASSESSMENT/PLAN:  1. Chronic midline low back pain without sciatica Patient has failed Naprosyn.  I doubt she is compliant with walking and exercise.  She wishes to see an orthopedic surgeon so this is arranged.  Back x-ray with degenerative findings and minor spondylolisthesis. - Ambulatory referral to Orthopedic Surgery  2. Essential hypertension Not well controlled.  Not taking medications.  3. Controlled type 2 diabetes mellitus without complication, without long-term current use of insulin [facility]) Referral placed - Ambulatory referral to Ophthalmology   Patient Instructions  Take the medicines every day I am referring you to a orthopedic for the back pain Walk every day that you  are able Do back exercises twice a day See me in a month The next appt should be in the afternoon Need referral to the eye doctor for DIABETIC EYE EXAM   Raylene Everts, MD

## 2017-01-24 ENCOUNTER — Telehealth: Payer: Self-pay

## 2017-01-24 NOTE — Telephone Encounter (Signed)
Pt called and said she should have received BP med and pain med for her back and legs after her appt on Monday 12/31.  She said noting was ever called in.  Please check on this and call her back at [phone].  Thank You

## 2017-01-24 NOTE — Telephone Encounter (Signed)
Called and spoke to Leah King. Advised her we did rx her new dose of lisinopril on 12 13 18, and she needs to check with her pharmacy. Also aware of dr Joanie Coddingtonnelsons advice to take aleve or advil for back pain, and an ortho referral was placed.

## 2017-01-30 ENCOUNTER — Telehealth: Payer: Self-pay | Admitting: Family Medicine

## 2017-01-30 NOTE — Telephone Encounter (Signed)
Has she seen the orthopedic surgeon?  She can take over-the-counter pain medication until she sees the specialist

## 2017-01-30 NOTE — Telephone Encounter (Signed)
Pt called in left a msg that she went to the Dr to get the Xray like you advised, and her leg and back are hurting worse, she needs something for pain.Marland Kitchen. Please call

## 2017-01-30 NOTE — Telephone Encounter (Signed)
Patient left message regarding back and legs- states walking hurts, has had x-rays, wants medication.

## 2017-01-30 NOTE — Telephone Encounter (Signed)
At her last visit I referred her to an orthopedic surgeon

## 2017-01-31 NOTE — Telephone Encounter (Signed)
I have attempted to call Breella, no answer, no answering machine.

## 2017-02-01 NOTE — Telephone Encounter (Signed)
I advised the patient to continue with OTC Pain Medicine per Dr Joanie CoddingtonNelsons note, also advised that she can call Dr Diamantina [facility]Harrisons office for an Appt.

## 2017-02-22 ENCOUNTER — Ambulatory Visit: Payer: Medicare Other | Admitting: Family Medicine

## 2017-02-28 ENCOUNTER — Encounter: Payer: Self-pay | Admitting: Orthopedic Surgery

## 2017-02-28 ENCOUNTER — Ambulatory Visit (INDEPENDENT_AMBULATORY_CARE_PROVIDER_SITE_OTHER): Payer: Medicare Other | Admitting: Orthopedic Surgery

## 2017-02-28 VITALS — BP 146/96 | HR 96 | Ht 63.0 in | Wt 196.0 lb

## 2017-02-28 DIAGNOSIS — M5136 Other intervertebral disc degeneration, lumbar region: Secondary | ICD-10-CM

## 2017-02-28 NOTE — Patient Instructions (Addendum)
Physical therapy has been ordered for you at [facility] [phone] is the phone number to call if you want to call to schedule. Please let us know if you do not hear anything within one week.   Degenerative Disk Disease Degenerative disk disease is a condition caused by the changes that occur in spinal disks as you grow older. Spinal disks are soft and compressible disks located between the bones of your spine (vertebrae). These disks act like shock absorbers. Degenerative disk disease can affect the whole spine. However, the neck and lower back are most commonly affected. Many changes can occur in the spinal disks with aging, such as:  The spinal disks may dry and shrink.  Small tears may occur in the tough, outer covering of the disk (annulus).  The disk space may become smaller due to loss of water.  Abnormal growths in the bone (spurs) may occur. This can put pressure on the nerve roots exiting the spinal canal, causing pain.  The spinal canal may become narrowed.  What increases the risk?  Being overweight.  Having a family history of degenerative disk disease.  Smoking.  There is increased risk if you are doing heavy lifting or have a sudden injury. What are the signs or symptoms? Symptoms vary from person to person and may include:  Pain that varies in intensity. Some people have no pain, while others have severe pain. The location of the pain depends on the part of your backbone that is affected. ? You will have neck or arm pain if a disk in the neck area is affected. ? You will have pain in your back, buttocks, or legs if a disk in the lower back is affected.  Pain that becomes worse while bending, reaching up, or with twisting movements.  Pain that may start gradually and then get worse as time passes. It may also start after a major or minor injury.  Numbness or tingling in the arms or legs.  How is this diagnosed? Your health care provider will ask you about your  symptoms and about activities or habits that may cause the pain. He or she may also ask about any injuries, diseases, or treatments you have had. Your health care provider will examine you to check for the range of movement that is possible in the affected area, to check for strength in your extremities, and to check for sensation in the areas of the arms and legs supplied by different nerve roots. You may also have:  An X-ray of the spine.  Other imaging tests, such as MRI.  How is this treated? Your health care provider will advise you on the best plan for treatment. Treatment may include:  Medicines.  Rehabilitation exercises.  Follow these instructions at home:  Follow proper lifting and walking techniques as advised by your health care provider.  Maintain good posture.  Exercise regularly as advised by your health care provider.  Perform relaxation exercises.  Change your sitting, standing, and sleeping habits as advised by your health care provider.  Change positions frequently.  Lose weight or maintain a healthy weight as advised by your health care provider.  Do not use any tobacco products, including cigarettes, chewing tobacco, or electronic cigarettes. If you need help quitting, ask your health care provider.  Wear supportive footwear.  Take medicines only as directed by your health care provider. Contact a health care provider if:  Your pain does not go away within 1-4 weeks.  You   have significant appetite or weight loss. Get help right away if:  Your pain is severe.  You notice weakness in your arms, hands, or legs.  You begin to lose control of your bladder or bowel movements.  You have fevers or night sweats. This information is not intended to replace advice given to you by your health care provider. Make sure you discuss any questions you have with your health care provider. Document Released: 11/06/2006 Document Revised: 06/17/2015 Document Reviewed:  05/13/2013 Elsevier Interactive Patient Education  2018 Elsevier Inc.  

## 2017-02-28 NOTE — Progress Notes (Signed)
Patient ID: Analy King, female   DOB: Feb 25, 1955, 62 y.o.   MRN: [id number]  Back pain back pain times 3 years  HPI Leah King is a 62 y.o. female. 62 years old presents with 3-year history of back pain  She has not had any treatment although she is on Elavil gabapentin and naproxen for other reasons.  She says he had arthritis and was treated for that but her doctor passed away  She is referred to [place] from Dr. Meda King x-rays show degenerative disc disease throughout the lumbar spine  She has bilateral leg pain which is constant moderate in severity dull aching pain shoulder pain arm pain and again of course lower back pain no injury or trauma no prior treatment   Review of Systems Review of Systems  Gastrointestinal: Negative.   Genitourinary: Negative.   Musculoskeletal: Positive for back pain, joint swelling and myalgias.  Neurological: Negative for weakness and numbness.    Past Medical History:  Diagnosis Date  . Anxiety   . Arthritis    back and legs  . Depression   . Diabetes mellitus without complication ([place])   . GERD (gastroesophageal reflux disease)   . Hyperlipidemia   . Hypertension     Past Surgical History:  Procedure Laterality Date  . SKIN GRAFT FULL THICKNESS ARM      Family History  Problem Relation Age of Onset  . Alcohol abuse Mother   . Heart disease Father   . Hypertension Son   . Hypertension Daughter   . Diabetes Daughter   . Alzheimer's disease Maternal Grandmother   . Diabetes Maternal Grandfather    was reviewed  Social History Social History   Tobacco Use  . Smoking status: Current Every Day Smoker    Packs/day: 0.25    Types: Cigarettes    Start date: 01/23/1990  . Smokeless tobacco: Never Used  . Tobacco comment: 2 cig a day  Substance Use Topics  . Alcohol use: No  . Drug use: No    No Known Allergies  Current Outpatient Medications  Medication Sig Dispense Refill  . amitriptyline (ELAVIL) 25 MG tablet Take 1 tablet  (25 mg total) by mouth at bedtime. 30 tablet 11  . aspirin EC 81 MG tablet Take 1 tablet (81 mg total) by mouth daily. 30 tablet 11  . blood glucose meter kit and supplies KIT Dispense based on patient and insurance preference. 1 each 0  . busPIRone (BUSPAR) 5 MG tablet Take 1 tablet (5 mg total) by mouth 3 (three) times daily. 90 tablet 11  . cetirizine (ZYRTEC) 10 MG tablet Take 1 tablet (10 mg total) by mouth daily. 30 tablet 11  . gabapentin (NEURONTIN) 300 MG capsule Take 1 capsule (300 mg total) by mouth 3 (three) times daily. 90 capsule 11  . lisinopril (PRINIVIL,ZESTRIL) 10 MG tablet Take 1 tablet (10 mg total) by mouth daily. 90 tablet 3  . metFORMIN (GLUCOPHAGE) 500 MG tablet Take 1 tablet (500 mg total) by mouth 2 (two) times daily with a meal. 60 tablet 11  . naproxen (NAPROSYN) 500 MG tablet Take 1 tablet (500 mg total) by mouth 2 (two) times daily with a meal. 60 tablet 11  . ranitidine (ZANTAC) 150 MG tablet TAKE 1 TABLET BY MOUTH TWICE DAILY. 180 tablet 3  . simvastatin (ZOCOR) 40 MG tablet Take 1 tablet (40 mg total) by mouth daily. 30 tablet 11  . venlafaxine XR (EFFEXOR-XR) 75 MG 24 hr capsule Take 1 capsule (75  mg total) by mouth daily with breakfast. 30 capsule 11   No current facility-administered medications for this visit.        Physical Exam There were no vitals taken for this visit. Physical Exam Gen. appearance: The patient is well-developed and well-nourished grooming and hygiene are normal The patient is oriented to person place and time The patient's mood is normal and the affect is normal  Ortho Exam Gait assessment: The patient stands with normal gait and station  Lumbar spine Tenderness  to palpation is noted in the lower L4-5 and 5 S1 segment  Range of motion flexion extension decreased in both directions pain with flexion  Muscle tone normal muscle tone on the right and left sides of the spine  Lower extremities right and left Normal range of  motion hip knee and ankle All 3 joints are reduced and stable  Strength right lower extremity normal  Strength left lower extremity normal  Neurologic right lower extremity examination  Reflexes were 2+ and equal at the knee and 1+ and equal at the ankle    Sensation was normal in both feet and legs    Babinski's tests were down going  Straight leg raise testing was negative/ normal bilaterally  The vascular examination revealed normal dorsalis pedis pulses in both feet and both feet were warm with good capillary refill   Data Reviewed Imaging studies show degenerative disc disease lumbar spine see hospital dictated report x-ray has been personally reviewed I agree with the report  Assessment  Encounter Diagnosis  Name Primary?  . DDD (degenerative disc disease), lumbar Yes     Plan Start with 6 weeks of physical therapy first if no improvement further imaging can be done to evaluate the neural ELEMENTS  Leah Abbott, MD 02/28/2017 2:41 PM

## 2017-03-07 ENCOUNTER — Ambulatory Visit (INDEPENDENT_AMBULATORY_CARE_PROVIDER_SITE_OTHER): Payer: Medicare Other | Admitting: Family Medicine

## 2017-03-07 ENCOUNTER — Other Ambulatory Visit: Payer: Self-pay

## 2017-03-07 ENCOUNTER — Encounter: Payer: Self-pay | Admitting: Family Medicine

## 2017-03-07 VITALS — BP 158/104 | HR 88 | Temp 97.5°F | Resp 18 | Ht 63.0 in | Wt 199.0 lb

## 2017-03-07 DIAGNOSIS — I10 Essential (primary) hypertension: Secondary | ICD-10-CM

## 2017-03-07 DIAGNOSIS — M545 Low back pain: Secondary | ICD-10-CM | POA: Diagnosis not present

## 2017-03-07 DIAGNOSIS — G8929 Other chronic pain: Secondary | ICD-10-CM | POA: Diagnosis not present

## 2017-03-07 NOTE — Progress Notes (Signed)
Chief Complaint  Patient presents with  . Hypertension   Patient is here for follow-up.  Her blood pressure still high.  She brings with her her blood pressure medication.  They are in a blister pack from her pharmacy.  The lisinopril listed in her medicines from the pharmacy is 2.5 mg.  Lisinopril we called into the pharmacy is 10 mg.  We phoned her pharmacy and they are going to pick up her blister pack, and give her corrected dose. She still complains of back pain and leg pain.  She saw Dr. Aline Brochure in orthopedics.  He is ordered physical therapy.  Starts tomorrow. She wants a brace for her back, and for both legs.  I told her that this would not be helpful for her pain complaints and could end up causing complications.  She can address this with her orthopedist.  Patient Active Problem List   Diagnosis Date Noted  . Controlled type 2 diabetes mellitus without complication, without long-term current use of insulin ([place]) 09/08/2016  . HLD (hyperlipidemia) 09/08/2016  . Essential hypertension 09/08/2016  . Environmental allergies 09/08/2016  . GERD without esophagitis 09/08/2016  . Chronic back pain 09/08/2016  . Depression with anxiety 09/08/2016  . Insomnia 09/08/2016    Outpatient Encounter Medications as of 03/07/2017  Medication Sig  . amitriptyline (ELAVIL) 25 MG tablet Take 1 tablet (25 mg total) by mouth at bedtime.  Marland Kitchen aspirin EC 81 MG tablet Take 1 tablet (81 mg total) by mouth daily.  . blood glucose meter kit and supplies KIT Dispense based on patient and insurance preference.  . busPIRone (BUSPAR) 5 MG tablet Take 1 tablet (5 mg total) by mouth 3 (three) times daily.  . cetirizine (ZYRTEC) 10 MG tablet Take 1 tablet (10 mg total) by mouth daily.  Marland Kitchen gabapentin (NEURONTIN) 300 MG capsule Take 1 capsule (300 mg total) by mouth 3 (three) times daily.  Marland Kitchen lisinopril (PRINIVIL,ZESTRIL) 10 MG tablet Take 1 tablet (10 mg total) by mouth daily.  . metFORMIN (GLUCOPHAGE) 500 MG  tablet Take 1 tablet (500 mg total) by mouth 2 (two) times daily with a meal.  . naproxen (NAPROSYN) 500 MG tablet Take 1 tablet (500 mg total) by mouth 2 (two) times daily with a meal.  . ranitidine (ZANTAC) 150 MG tablet TAKE 1 TABLET BY MOUTH TWICE DAILY.  . simvastatin (ZOCOR) 40 MG tablet Take 1 tablet (40 mg total) by mouth daily.  Marland Kitchen venlafaxine XR (EFFEXOR-XR) 75 MG 24 hr capsule Take 1 capsule (75 mg total) by mouth daily with breakfast.   No facility-administered encounter medications on file as of 03/07/2017.     No Known Allergies  Review of Systems  Constitutional: Negative for activity change, appetite change and unexpected weight change.  HENT: Positive for dental problem. Negative for congestion, postnasal drip and rhinorrhea.        No dental care, dental fractures  Eyes: Positive for visual disturbance. Negative for redness.       Overdue for eye exam  Respiratory: Negative for cough and shortness of breath.   Cardiovascular: Negative for chest pain, palpitations and leg swelling.  Gastrointestinal: Negative for abdominal pain, constipation and diarrhea.  Genitourinary: Negative for difficulty urinating, frequency and menstrual problem.  Musculoskeletal: Positive for back pain. Negative for arthralgias.       Back pain chronic  Neurological: Negative for dizziness and headaches.  Psychiatric/Behavioral: Negative for dysphoric mood and sleep disturbance. The patient is not nervous/anxious.  BP (!) 158/104 (BP Location: Left Arm, Patient Position: Sitting, Cuff Size: Normal)   Pulse 88   Temp (!) 97.5 F (36.4 C) (Temporal)   Resp 18   Ht _0  (1.6 m)   Wt 199 lb 0.6 oz (90.3 kg)   SpO2 95%   BMI 35.26 kg/m   Physical Exam  Constitutional: She is oriented to person, place, and time. She appears well-developed and well-nourished.  No apparent discomfort  HENT:  Head: Normocephalic and atraumatic.  Mouth/Throat: Oropharynx is clear and moist.  Eyes:  Conjunctivae are normal. Pupils are equal, round, and reactive to light.  Neck: Normal range of motion.  Cardiovascular: Normal rate, regular rhythm and normal heart sounds.  Pulmonary/Chest: Effort normal and breath sounds normal.  Musculoskeletal: Normal range of motion.  Back appears straight and symmetric.  No tenderness over the lumbar muscles.  Mild tenderness centrally over L4-5.  No SI tenderness.  Strength sensation range of motion and reflexes are symmetric in both lower extremities with no focal neurologic findings  Neurological: She is alert and oriented to person, place, and time. She displays normal reflexes.  Psychiatric: She has a normal mood and affect. Her behavior is normal.    ASSESSMENT/PLAN:  1.  Essential hypertension Not controlled.  Patient has not getting the correct dose of medicine.  We will recheck her blood pressure once she gets on the correct dose of lisinopril. 2.  Chronic back pain.  Refered to orthopedic.  Will follow his instructions.  Patient Instructions  We are increasing your blood pressure medicine Continue current medicines See me in 1 month Call for problems   Raylene Everts, MD

## 2017-03-07 NOTE — Patient Instructions (Signed)
We are increasing your blood pressure medicine Continue current medicines See me in 1 month Call for problems

## 2017-03-08 ENCOUNTER — Other Ambulatory Visit: Payer: Self-pay | Admitting: Family Medicine

## 2017-03-08 ENCOUNTER — Ambulatory Visit (HOSPITAL_COMMUNITY): Payer: Medicare Other | Admitting: Physical Therapy

## 2017-03-15 DIAGNOSIS — E113293 Type 2 diabetes mellitus with mild nonproliferative diabetic retinopathy without macular edema, bilateral: Secondary | ICD-10-CM | POA: Diagnosis not present

## 2017-03-16 LAB — HM DIABETES EYE EXAM

## 2017-03-20 ENCOUNTER — Ambulatory Visit (HOSPITAL_COMMUNITY): Payer: Medicare Other | Attending: Orthopedic Surgery | Admitting: Physical Therapy

## 2017-03-20 ENCOUNTER — Other Ambulatory Visit: Payer: Self-pay

## 2017-03-20 ENCOUNTER — Encounter (HOSPITAL_COMMUNITY): Payer: Self-pay | Admitting: Physical Therapy

## 2017-03-20 DIAGNOSIS — M6281 Muscle weakness (generalized): Secondary | ICD-10-CM | POA: Diagnosis not present

## 2017-03-20 DIAGNOSIS — M5416 Radiculopathy, lumbar region: Secondary | ICD-10-CM

## 2017-03-20 NOTE — Patient Instructions (Addendum)
Isometric Abdominal    Lying on back with knees bent, tighten stomach by pressing elbows down. Hold ___5_ seconds. Repeat __10__ times per set. Do ___1_ sets per session. Do __2__ sessions per day.  http://orth.exer.us/1086   Copyright  VHI. All rights reserved.  Pelvic Tilt: Posterior - Legs Bent (Supine)    Tighten stomach and flatten back by rolling pelvis down. Hold _5___ seconds. Relax. Repeat _10___ times per set. Do _1___ sets per session. Do __2__ sessions per day.  http://orth.exer.us/202   Copyright  VHI. All rights reserved.  Bridging    Slowly raise buttocks from floor, keeping stomach tight. Repeat 10____ times per set. Do _1___ sets per session. Do _2___ sessions per day.  http://orth.exer.us/1096   Copyright  VHI. All rights reserved.

## 2017-03-20 NOTE — Therapy (Addendum)
[facility] [address] [place], [place], [address] Phone: [phone]   Fax:  [phone]  Physical Therapy Evaluation  Patient Details  Name: [name] MRN: [id number] Date of Birth: 10/05/1955 Referring Provider: Fuller CanadaStanley Harrison   Encounter Date: 03/20/2017  PT End of Session - 03/20/17 1354    Visit Number  1    Number of Visits  18    Date for PT Re-Evaluation  04/19/17    Authorization Type  medicare     Authorization - Visit Number  1    Authorization - Number of Visits  10    PT Start Time  1300    PT Stop Time  1355    PT Time Calculation (min)  55 min    Activity Tolerance  Patient tolerated treatment well    Behavior During Therapy  [facility] for tasks assessed/performed       Past Medical History:  Diagnosis Date  . Anxiety   . Arthritis    back and legs  . Depression   . Diabetes mellitus without complication (HCC)   . GERD (gastroesophageal reflux disease)   . Hyperlipidemia   . Hypertension     Past Surgical History:  Procedure Laterality Date  . SKIN GRAFT FULL THICKNESS ARM      There were no vitals filed for this visit.   Subjective Assessment - 03/20/17 1314    Subjective  Leah King states that she has had back pain for years.  She states that her pain has been progressively increasing therefore she has been referred to skilled physical therapy.  She states that standing and walking are the most painful activities. Her pain is bilateral and radiates into her ankles.  She has leg pain almost daily.  The pain is the worst in the mornings and improves as the day progresses.      Pertinent History  HTN, BP     Limitations  Standing;Walking    How long can you sit comfortably?  no problem     How long can you stand comfortably?  30 minutes     How long can you walk comfortably?  15 minutes     Patient Stated Goals  improved activity tolerance, decreased pain.     Currently in Pain?  Yes    Pain Score  7      Pain Location  Back    Pain Orientation  Right;Left    Pain Descriptors / Indicators  Aching    Pain Type  Chronic pain    Pain Onset  More than a month ago    Pain Frequency  Intermittent    Aggravating Factors   walking and standing     Pain Relieving Factors  icy hot, pain patches  minimal benefit     Effect of Pain on Daily Activities  decreases          [facility] PT Assessment - 03/20/17 0001      Assessment   Medical Diagnosis  Lumbar radiculopathy    Referring Provider  Fuller CanadaStanley Harrison    Onset Date/Surgical Date  02/28/17    Next MD Visit  05/07/2017    Prior Therapy  long ago       Precautions   Precautions  None      Restrictions   Weight Bearing Restrictions  No      Balance Screen   Has the patient fallen in the past 6 months  No  Has the patient had a decrease in activity level because of a fear of falling?   No    Is the patient reluctant to leave their home because of a fear of falling?   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     Leisure  walk      Cognition   Overall Cognitive Status  Within Functional Limits for tasks assessed      Observation/Other Assessments   Focus on Therapeutic Outcomes (FOTO)   65      Posture/Postural Control   Posture/Postural Control  Postural limitations    Postural Limitations  Anterior pelvic tilt;Increased lumbar lordosis      ROM / Strength   AROM / PROM / Strength  Strength;AROM      AROM   AROM Assessment Site  Lumbar    Lumbar Flexion  -- Pt able to touch floor but mainly hip flexion back is flat     Lumbar Extension  28      Strength   Strength Assessment Site  Lumbar;Hip;Knee;Ankle    Right/Left Hip  Right;Left    Right Hip Flexion  3/5    Right Hip Extension  3+/5    Right Hip ABduction  3/5     Left Hip Flexion  3/5    Left Hip Extension  3+/5    Left Hip ABduction  3/5    Right/Left Knee  Right;Left    Right Knee Flexion  5/5    Right Knee Extension  5/5    Left Knee Flexion  5/5    Left Knee Extension  5/5    Right/Left Ankle  Right;Left    Right Ankle Dorsiflexion  5/5    Left Ankle Dorsiflexion  5/5      Flexibility   Soft Tissue Assessment /Muscle Length  yes    Hamstrings  B 160              Objective measurements completed on examination: See above findings.      [facility] Adult PT Treatment/Exercise - 03/20/17 0001      Exercises   Exercises  Lumbar      Lumbar Exercises: Stretches   Pelvic Tilt  10 seconds      Lumbar Exercises: Supine   Ab Set  10 reps    Bridge  10 reps               PT Short Term Goals - 03/20/17 1411      PT SHORT TERM GOAL #1   Title  PT to demonstrate good sitting posture to decrease stress on lumbar spine.     Time  3    Period  Weeks    Status  New    Target Date  04/10/17      PT SHORT TERM GOAL #2   Title  Pt to demonstrate good body mechanics for activities such as lifting, pushing vacuum cleaner and reaching across the building.     Time  3    Period  Weeks    Status  New      PT SHORT TERM GOAL #3   Title  PT pain to be no greater than a 4/10  to allow pt to be able to sit in comfort for 30 minutes for traveling and having a meal.     Time  3    Period  Weeks    Status  New        PT Long Term Goals - 03/20/17 1414      PT LONG TERM GOAL #1   Title  PT strength in her core and LE to be increased by one grade to allow pt to be able to complete her housework without having to rest.     Time  6    Period  Weeks    Status  New    Target Date  05/08/17      PT LONG TERM GOAL #2   Title  Pt to be able to tolerate sitting for over an hour for socialization.     Time  6    Period  Weeks    Status  New      PT LONG TERM GOAL #3   Title  PT to be able to walk for at least an hour to return to  her hobby of walking at least three times a week.     Time  6    Period  Weeks    Status  New             Plan - 03/20/17 1401    Clinical Impression Statement  Leah King is a 62 yo female who has had chronic low back pain.  Typically the pain would never alter her activities of daily living, however, for the past several months she has noticed that she is unble to walk for exercise like she normally does and needs to take breaks during her housework therefore she went the the physican who has referred her to skilled physical therapy.  Evaluation demonstrates decreased activity tolerance,  decreased muscle strength, postural dysfunction, poor body mechanics, and increased pain.  Leah King will benefit from skilled physical therapy to address these problems and improve her functional capacity.     Clinical Presentation  Stable    Clinical Decision Making  Low    Rehab Potential  Good    PT Frequency  3x / week    PT Duration  6 weeks    PT Treatment/Interventions  ADLs/Self Care Home Management;Therapeutic activities;Therapeutic exercise;Patient/family education;Manual techniques    PT Next Visit Plan  Begin knee to chest stretch, bent knee lift, clam, sidelying abduction, prone hip extension (with pillows under abdomen).  Progress stabilization to standing functional activities, Educate in proper posture and body mechanics.         Patient will benefit from skilled therapeutic intervention in order to improve the following deficits and impairments:  Decreased activity tolerance, Difficulty walking, Decreased strength, Pain, Improper body mechanics, Postural dysfunction, Obesity  Visit Diagnosis: Radiculopathy, lumbar region  Muscle weakness (generalized)     Problem List Patient Active Problem List   Diagnosis Date Noted  . Controlled type 2 diabetes mellitus without complication, without long-term current use of insulin (HCC) 09/08/2016  . HLD (hyperlipidemia) 09/08/2016  .  Essential hypertension 09/08/2016  . Environmental allergies 09/08/2016  . GERD without esophagitis 09/08/2016  . Chronic back pain 09/08/2016  . Depression with anxiety 09/08/2016  . Insomnia 09/08/2016    Virgina Organ, PT CLT [phone] 03/20/2017, 2:25 PM  [facility] [address] [place], [place], [address] Phone: [phone]   Fax:  [phone]  Name: Leah King MRN: [id number] Date of Birth: 09/09/1955

## 2017-03-23 ENCOUNTER — Telehealth (HOSPITAL_COMMUNITY): Payer: Self-pay | Admitting: Family Medicine

## 2017-03-23 ENCOUNTER — Ambulatory Visit (HOSPITAL_COMMUNITY): Payer: Medicare Other | Admitting: Physical Therapy

## 2017-03-23 NOTE — Addendum Note (Signed)
Addended by: Bella KennedyUSSELL, CYNTHIA J on: 03/23/2017 02:22 PM   Modules accepted: Orders

## 2017-03-23 NOTE — Telephone Encounter (Signed)
03/23/17  pt called and said she is having issues with her stomach and vomiting

## 2017-03-26 ENCOUNTER — Ambulatory Visit (HOSPITAL_COMMUNITY): Payer: Medicare Other | Attending: Orthopedic Surgery

## 2017-03-26 ENCOUNTER — Encounter (HOSPITAL_COMMUNITY): Payer: Self-pay

## 2017-03-26 DIAGNOSIS — M6281 Muscle weakness (generalized): Secondary | ICD-10-CM | POA: Diagnosis not present

## 2017-03-26 DIAGNOSIS — M5416 Radiculopathy, lumbar region: Secondary | ICD-10-CM | POA: Diagnosis not present

## 2017-03-26 NOTE — Patient Instructions (Addendum)
Supine Knee-to-Chest, Unilateral    Lie on back, hands clasped behind one knee. Pull knee in toward chest until a comfortable stretch is felt in lower back and buttocks. Hold ___ seconds.  Repeat _1__ times per session. Do _2__ sessions per day.  Copyright  VHI. All rights reserved.  Supine Knee-to-Chest, Bilateral    Lie on back, hands clasped behind both knees. Pull knees in toward chest until a comfortable stretch is felt in lower back and buttocks. Hold _30__ seconds. Repeat 1___ times per session. Do _2__ sessions per day.  Copyright  VHI. All rights reserved.  Supine Crunch (Isometric)    While lying on back with knees bent, tense abdominal muscles. Hold _5__ seconds. Relax. Complete 1___ sets of 15___ repetitions. Perform _2__ sessions per day.  Copyright  VHI. All rights reserved.  Clam Shell 45 Degrees    Lying with hips and knees bent 45, one pillow between knees and ankles. Lift knee. Be sure pelvis does not roll backward. Do not arch back. Do 15___ times, each leg, _2__ times per day.  http://ss.exer.us/75   Copyright  VHI. All rights reserved.

## 2017-03-26 NOTE — Therapy (Signed)
[facility] [address] [place], [place], [address] Phone: [phone]   Fax:  [phone]  Physical Therapy Treatment  Patient Details  Name: Leah King MRN: [id number] Date of Birth: 07-Jun-1955 Referring Provider: Fuller Canada   Encounter Date: 03/26/2017  PT End of Session - 03/26/17 1118    Visit Number  2    Number of Visits  18    Date for PT Re-Evaluation  04/19/17    Authorization Type  medicare     Authorization - Visit Number  2    Authorization - Number of Visits  10    PT Start Time  1118    PT Stop Time  1158    PT Time Calculation (min)  40 min    Activity Tolerance  Patient tolerated treatment well    Behavior During Therapy  [facility] for tasks assessed/performed       Past Medical History:  Diagnosis Date  . Anxiety   . Arthritis    back and legs  . Depression   . Diabetes mellitus without complication (HCC)   . GERD (gastroesophageal reflux disease)   . Hyperlipidemia   . Hypertension     Past Surgical History:  Procedure Laterality Date  . SKIN GRAFT FULL THICKNESS ARM      There were no vitals filed for this visit.  Subjective Assessment - 03/26/17 1118    Subjective  tried to do her HEP and thinks she overdid it.    Pertinent History  HTN, BP     Limitations  Standing;Walking    How long can you sit comfortably?  no problem     How long can you stand comfortably?  30 minutes     How long can you walk comfortably?  15 minutes     Patient Stated Goals  improved activity tolerance, decreased pain.     Currently in Pain?  Yes    Pain Score  8     Pain Location  -- both thighs    Pain Orientation  Left;Right    Pain Descriptors / Indicators  Sharp there all the time today    Pain Onset  More than a month ago                      [facility] Adult PT Treatment/Exercise - 03/26/17 0001      Lumbar Exercises: Stretches   Single Knee to Chest Stretch  Right;Left;2 reps;30 seconds    Double Knee to Chest Stretch  2 reps;30 seconds      Lumbar Exercises: Supine   Ab Set  10 reps    Pelvic Tilt  10 reps    Bridge  10 reps;2 seconds      Lumbar Exercises: Sidelying   Clam  Both;10 reps;2 seconds    Hip Abduction  Both;10 reps;2 seconds      Lumbar Exercises: Prone   Straight Leg Raise  10 reps;2 seconds             PT Education - 03/26/17 1154    Education provided  Yes    Education Details  advanced HEP, single/double knee to chest, clam, sidelying abd, prone hip ext    Person(s) Educated  Patient    Methods  Explanation;Handout    Comprehension  Verbalized understanding       PT Short Term Goals - 03/20/17 1411      PT SHORT TERM GOAL #1   Title  PT to demonstrate good sitting posture to decrease stress on lumbar spine.     Time  3    Period  Weeks    Status  New    Target Date  04/10/17      PT SHORT TERM GOAL #2   Title  Pt to demonstrate good body mechanics for activities such as lifting, pushing vacuum cleaner and reaching across the building.     Time  3    Period  Weeks    Status  New      PT SHORT TERM GOAL #3   Title  PT pain to be no greater than a 4/10 to allow pt to be able to sit in comfort for 30 minutes for traveling and having a meal.     Time  3    Period  Weeks    Status  New        PT Long Term Goals - 03/20/17 1414      PT LONG TERM GOAL #1   Title  PT strength in her core and LE to be increased by one grade to allow pt to be able to complete her housework without having to rest.     Time  6    Period  Weeks    Status  New    Target Date  05/08/17      PT LONG TERM GOAL #2   Title  Pt to be able to tolerate sitting for over an hour for socialization.     Time  6    Period  Weeks    Status  New      PT LONG TERM GOAL #3   Title  PT to be able to walk for at least an hour to return to her hobby of walking at least three times a week.     Time  6    Period  Weeks    Status  New            Plan -  03/26/17 1202    Clinical Impression Statement  Patient tolerated treatment well today. She reported she has been performing HEP; not sure if it is helping with her pain yet. She exhibited coordination difficulties with core/trunk level 1 stabilization exercises and decreased core and lower extremity strength. Educated patient on log roling in and out of bed and patient replied log rolling is a lot more comfortable. Patient would benefit from continued skilled physical therapy for strengthening, coordination, and body mechanics education.       Patient will benefit from skilled therapeutic intervention in order to improve the following deficits and impairments:     Visit Diagnosis: Radiculopathy, lumbar region  Muscle weakness (generalized)     Problem List Patient Active Problem List   Diagnosis Date Noted  . Controlled type 2 diabetes mellitus without complication, without long-term current use of insulin (HCC) 09/08/2016  . HLD (hyperlipidemia) 09/08/2016  . Essential hypertension 09/08/2016  . Environmental allergies 09/08/2016  . GERD without esophagitis 09/08/2016  . Chronic back pain 09/08/2016  . Depression with anxiety 09/08/2016  . Insomnia 09/08/2016    Katina DungBarbara D. Hartnett-Rands, MS, PT Per [facility]ador[facility] #[phone] 03/26/2017, 12:06 PM  [facility] [address] [place], [place], [address] Phone: [phone]   Fax:  [phone]  Name: Leah King MRN: [id number] Date of Birth: 02/04/1955

## 2017-03-28 ENCOUNTER — Ambulatory Visit (HOSPITAL_COMMUNITY): Payer: Medicare Other

## 2017-03-28 ENCOUNTER — Other Ambulatory Visit: Payer: Self-pay

## 2017-03-28 ENCOUNTER — Encounter (HOSPITAL_COMMUNITY): Payer: Self-pay

## 2017-03-28 DIAGNOSIS — M5416 Radiculopathy, lumbar region: Secondary | ICD-10-CM

## 2017-03-28 DIAGNOSIS — M6281 Muscle weakness (generalized): Secondary | ICD-10-CM

## 2017-03-28 NOTE — Therapy (Signed)
[facility] [address] [place], [place], [address] Phone: [phone]   Fax:  [phone]  Physical Therapy Treatment  Patient Details  Name: Leah King MRN: [id number] Date of Birth: [birth year] Referring Provider: Fuller Canada   Encounter Date: 03/28/2017  PT End of Session - 03/28/17 1007    Visit Number  3    Number of Visits  18    Date for PT Re-Evaluation  04/19/17    Authorization Type  medicare     Authorization - Visit Number  3    Authorization - Number of Visits  10    PT Start Time  [phone]    PT Stop Time  1031    PT Time Calculation (min)  43 min    Activity Tolerance  Patient tolerated treatment well    Behavior During Therapy  [facility] for tasks assessed/performed       Past Medical History:  Diagnosis Date  . Anxiety   . Arthritis    back and legs  . Depression   . Diabetes mellitus without complication (HCC)   . GERD (gastroesophageal reflux disease)   . Hyperlipidemia   . Hypertension     Past Surgical History:  Procedure Laterality Date  . SKIN GRAFT FULL THICKNESS ARM      There were no vitals filed for this visit.  Subjective Assessment - 03/28/17 0954    Subjective  She feels better today, and states she is not it too much pain overall. She reports diong her HEP and havnig no discomfort from the exercises.    Pertinent History  HTN, BP     Limitations  Standing;Walking    How long can you sit comfortably?  no problem     How long can you stand comfortably?  30 minutes     How long can you walk comfortably?  15 minutes     Patient Stated Goals  improved activity tolerance, decreased pain.     Currently in Pain?  Yes    Pain Score  6     Pain Location  Back    Pain Orientation  Left;Right    Pain Descriptors / Indicators  Aching    Pain Type  Chronic pain    Pain Onset  More than a month ago    Pain Frequency  Intermittent       [facility] Adult PT Treatment/Exercise - 03/28/17 0001      Lumbar Exercises: Stretches   Passive Hamstring Stretch  Right;Left;30 seconds;2 reps    Single Knee to Chest Stretch  Right;Left;10 seconds;Limitations    Single Knee to Chest Stretch Limitations  10 reps each; with sheet    Double Knee to Chest Stretch  10 seconds;Limitations    Double Knee to Chest Stretch Limitations  10 reps; with sheet      Lumbar Exercises: Supine   Pelvic Tilt  10 reps;Limitations    Pelvic Tilt Limitations  2 sets, 5 second holds    Bridge  10 reps;3 seconds;Limitations    Bridge Limitations  2 sets      Lumbar Exercises: Sidelying   Clam  Both;10 reps;2 seconds;Limitations    Clam Limitations  red theraband, 2 sets      Lumbar Exercises: Prone   Straight Leg Raise  --    Straight Leg Raises Limitations  --        PT Education - 03/28/17 1014    Education provided  Yes    Education  Details  Educated on exercise form/technique throughout session and updated HEP.    Person(s) Educated  Patient    Methods  Explanation;Handout    Comprehension  Verbalized understanding       PT Short Term Goals - 03/20/17 1411      PT SHORT TERM GOAL #1   Title  PT to demonstrate good sitting posture to decrease stress on lumbar spine.     Time  3    Period  Weeks    Status  New    Target Date  04/10/17      PT SHORT TERM GOAL #2   Title  Pt to demonstrate good body mechanics for activities such as lifting, pushing vacuum cleaner and reaching across the building.     Time  3    Period  Weeks    Status  New      PT SHORT TERM GOAL #3   Title  PT pain to be no greater than a 4/10 to allow pt to be able to sit in comfort for 30 minutes for traveling and having a meal.     Time  3    Period  Weeks    Status  New        PT Long Term Goals - 03/20/17 1414      PT LONG TERM GOAL #1   Title  PT strength in her core and LE to be increased by one grade to allow pt to be able to complete her housework without having to rest.     Time  6    Period  Weeks     Status  New    Target Date  05/08/17      PT LONG TERM GOAL #2   Title  Pt to be able to tolerate sitting for over an hour for socialization.     Time  6    Period  Weeks    Status  New      PT LONG TERM GOAL #3   Title  PT to be able to walk for at least an hour to return to her hobby of walking at least three times a week.     Time  6    Period  Weeks    Status  New        Plan - 03/28/17 1008    Clinical Impression Statement  Patient is progressing well in therapy and advanced repetitions today for strengthening with no increase in pain. She reported she has been performing her HEP and has backed off the amount which has decreased her pain. She continues to require verbal/tactile cues to achieve correct form with low level core exercises and required rest breaks throughout session secondary to low muscular endurance. She will continue to benefit from skilled physical therapy for strengthening, coordination, and body mechanics education to improve mobility, reduce pain, and improve QOL.    Rehab Potential  Good    PT Frequency  3x / week    PT Duration  6 weeks    PT Treatment/Interventions  ADLs/Self Care Home Management;Therapeutic activities;Therapeutic exercise;Patient/family education;Manual techniques    PT Next Visit Plan  Continue with knee to chest stretch, bent knee lift, clam, sidelying abduction, prone hip extension (with pillows under abdomen).  Progress stabilization to standing functional activities, Educate in proper posture and body mechanics.  Continue with hamstring stretch and initiate 4 way hip in standing.    PT Home Exercise Plan  Eval: bridge, posterior  pelvic tilt, ab set; 03/28/17 - clamshell with band;     Consulted and Agree with Plan of Care  Patient       Patient will benefit from skilled therapeutic intervention in order to improve the following deficits and impairments:  Decreased activity tolerance, Difficulty walking, Decreased strength, Pain, Improper  body mechanics, Postural dysfunction, Obesity  Visit Diagnosis: Radiculopathy, lumbar region  Muscle weakness (generalized)     Problem List Patient Active Problem List   Diagnosis Date Noted  . Controlled type 2 diabetes mellitus without complication, without long-term current use of insulin (HCC) 09/08/2016  . HLD (hyperlipidemia) 09/08/2016  . Essential hypertension 09/08/2016  . Environmental allergies 09/08/2016  . GERD without esophagitis 09/08/2016  . Chronic back pain 09/08/2016  . Depression with anxiety 09/08/2016  . Insomnia 09/08/2016    Valentino Saxon, PT, DPT Physical Therapist with [facility]  03/28/2017 10:29 AM    [facility] [address] [place], [place], [address] Phone: [phone]   Fax:  [phone]  Name: Leah King MRN: [id number] Date of Birth: 12/28/1955

## 2017-03-28 NOTE — Patient Instructions (Signed)
   ELASTIC BAND - SIDELYING CLAM SHELL - CLAMSHELL: 1-2 sets of 10-15 repetitions  While lying on your side with your knees bent and an elastic band wrapped around your knees, draw up the top knee while keeping contact of your feet together as shown.   Do not let your pelvis roll back during the lifting movement.

## 2017-03-30 ENCOUNTER — Ambulatory Visit (HOSPITAL_COMMUNITY): Payer: Medicare Other | Admitting: Physical Therapy

## 2017-03-30 ENCOUNTER — Telehealth (HOSPITAL_COMMUNITY): Payer: Self-pay | Admitting: Physical Therapy

## 2017-03-30 NOTE — Telephone Encounter (Signed)
Patient is having flu like symptoms and knee pain and is canceling for today

## 2017-04-02 ENCOUNTER — Ambulatory Visit (HOSPITAL_COMMUNITY): Payer: Medicare Other | Admitting: Physical Therapy

## 2017-04-02 ENCOUNTER — Encounter: Payer: Self-pay | Admitting: Family Medicine

## 2017-04-04 ENCOUNTER — Encounter: Payer: Self-pay | Admitting: Family Medicine

## 2017-04-04 ENCOUNTER — Ambulatory Visit (INDEPENDENT_AMBULATORY_CARE_PROVIDER_SITE_OTHER): Payer: Medicare Other | Admitting: Family Medicine

## 2017-04-04 ENCOUNTER — Telehealth (HOSPITAL_COMMUNITY): Payer: Self-pay | Admitting: Family Medicine

## 2017-04-04 ENCOUNTER — Ambulatory Visit (HOSPITAL_COMMUNITY): Payer: Medicare Other

## 2017-04-04 VITALS — BP 134/90 | HR 91 | Temp 96.6°F | Ht 65.0 in | Wt 199.5 lb

## 2017-04-04 DIAGNOSIS — G8929 Other chronic pain: Secondary | ICD-10-CM | POA: Diagnosis not present

## 2017-04-04 DIAGNOSIS — Z1231 Encounter for screening mammogram for malignant neoplasm of breast: Secondary | ICD-10-CM | POA: Diagnosis not present

## 2017-04-04 DIAGNOSIS — E119 Type 2 diabetes mellitus without complications: Secondary | ICD-10-CM

## 2017-04-04 DIAGNOSIS — Z1211 Encounter for screening for malignant neoplasm of colon: Secondary | ICD-10-CM | POA: Diagnosis not present

## 2017-04-04 DIAGNOSIS — I10 Essential (primary) hypertension: Secondary | ICD-10-CM | POA: Diagnosis not present

## 2017-04-04 DIAGNOSIS — M545 Low back pain, unspecified: Secondary | ICD-10-CM

## 2017-04-04 DIAGNOSIS — F418 Other specified anxiety disorders: Secondary | ICD-10-CM | POA: Diagnosis not present

## 2017-04-04 DIAGNOSIS — Z1239 Encounter for other screening for malignant neoplasm of breast: Secondary | ICD-10-CM

## 2017-04-04 MED ORDER — IBUPROFEN 800 MG PO TABS: 800.0000 mg | ORAL_TABLET | Freq: Three times a day (TID) | ORAL | 1 refills | Status: DC | PRN

## 2017-04-04 MED ORDER — CYCLOBENZAPRINE HCL 5 MG PO TABS: 5.0000 mg | ORAL_TABLET | Freq: Every day | ORAL | 1 refills | Status: DC

## 2017-04-04 NOTE — Patient Instructions (Signed)
You need to sign papers for a cologuard screen This is looking for colon cancer  I have ordered a [facility]  Need to come back for blood work.  This to check on your diabetes management.  I have ordered new medicine for your back and body pain [facility]  Call the physical therapist for today and tell them you are not well enough to attend.  Try to go on Friday  You will need medical follow-up in 6 months.  We will let you know about appointments.

## 2017-04-04 NOTE — Telephone Encounter (Signed)
04/04/17  lady that brings patient to her appts came in to say she was still at a drs appt and has been there since 10am

## 2017-04-04 NOTE — Progress Notes (Signed)
Chief Complaint  Leah King presents with  . Follow-up    aching everywhere   Leah King is here for a routine follow-up.  Leah King complains of low back pain.  Leah King complains of aching "all over".  Naprosyn does not help.  Leah King told Leah King we can try switching Leah King to ibuprofen and see if this gives Leah King some better improvement. Leah King has increased Leah King lisinopril.  Leah King is happy with the results.  Leah King blood pressures have been better controlled.  No side effects from lisinopril. Leah King states that Leah King pains keep Leah King awake at night.  Leah King am going to give Leah King a low-dose of a muscle relaxer to take at bedtime.  Flexeril 5 mg as prescribed.  If some of Leah King achiness is from a fibromyalgia syndrome, perhaps this will help. Leah King states that Leah King wants to get "caught up" on all of Leah King cancer testing.  Previous visits Leah King has declined prevention.  Leah King is never had a colonoscopy and does not have colon cancer in Leah King family.  Leah King therefore qualifies for cologuard.  Leah King will order a mammogram on Leah King.  We will do a Pap smear at a future visit. Leah King has a history of well-controlled diabetes.  Leah King has not had an A1c in 6 months.  Leah King refuses to get laboratory testing today.  Leah King did place an order and Leah King promises Leah King will come later this week. Leah King states that Leah King did have a recent eye exam.  Leah King got a copy of this in the chart. Leah King has a appointment with a podiatrist next month.  Leah King states Leah King is having trouble managing Leah King thickened nails.  Leah King Active Problem List   Diagnosis Date Noted  . Controlled type 2 diabetes mellitus without complication, without long-term current use of insulin ([name]) 09/08/2016  . HLD (hyperlipidemia) 09/08/2016  . Essential hypertension 09/08/2016  . Environmental allergies 09/08/2016  . GERD without esophagitis 09/08/2016  . Chronic back pain 09/08/2016  . Depression with anxiety 09/08/2016  . Insomnia 09/08/2016    Outpatient Encounter Medications as of 04/04/2017  Medication Sig  .  amitriptyline (ELAVIL) 25 MG tablet Take 1 tablet (25 mg total) by mouth at bedtime.  Marland Kitchen aspirin EC 81 MG tablet Take 1 tablet (81 mg total) by mouth daily.  . blood glucose meter kit and supplies KIT Dispense based on Leah King and insurance preference.  . busPIRone (BUSPAR) 5 MG tablet Take 1 tablet (5 mg total) by mouth 3 (three) times daily.  . cetirizine (ZYRTEC) 10 MG tablet Take 1 tablet (10 mg total) by mouth daily.  Marland Kitchen gabapentin (NEURONTIN) 300 MG capsule Take 1 capsule (300 mg total) by mouth 3 (three) times daily.  Marland Kitchen lisinopril (PRINIVIL,ZESTRIL) 10 MG tablet Take 1 tablet (10 mg total) by mouth daily.  . metFORMIN (GLUCOPHAGE) 500 MG tablet Take 1 tablet (500 mg total) by mouth 2 (two) times daily with a meal.  . ranitidine (ZANTAC) 150 MG tablet TAKE 1 TABLET BY MOUTH TWICE DAILY.  . simvastatin (ZOCOR) 40 MG tablet Take 1 tablet (40 mg total) by mouth daily.  Marland Kitchen venlafaxine XR (EFFEXOR-XR) 75 MG 24 hr capsule Take 1 capsule (75 mg total) by mouth daily with breakfast.  . cyclobenzaprine (FLEXERIL) 5 MG tablet Take 1 tablet (5 mg total) by mouth at bedtime.  Marland Kitchen ibuprofen (ADVIL,MOTRIN) 800 MG tablet Take 1 tablet (800 mg total) by mouth every 8 (eight) hours as needed for moderate pain.   No facility-administered encounter medications on file as  of 04/04/2017.     No Known Allergies  Review of Systems  Constitutional: Negative for activity change, appetite change and unexpected weight change.  HENT: Positive for dental problem. Negative for congestion, postnasal drip and rhinorrhea.        No dental care, dental fractures  Eyes: Positive for visual disturbance. Negative for redness.       Recent eye exam  Respiratory: Negative for cough and shortness of breath.   Cardiovascular: Negative for chest pain, palpitations and leg swelling.  Gastrointestinal: Negative for abdominal pain, constipation and diarrhea.  Genitourinary: Negative for difficulty urinating, frequency and menstrual  problem.  Musculoskeletal: Positive for back pain and neck pain. Negative for arthralgias.       Back pain chronic, generalized body aching  Neurological: Positive for headaches. Negative for dizziness.       Headache last 2 days.  Points to forehead.  No associated symptoms  Psychiatric/Behavioral: Negative for dysphoric mood and sleep disturbance. The Leah King is not nervous/anxious.     BP 134/90 (BP Location: Right Arm, Leah King Position: Sitting, Cuff Size: Normal)   Pulse 91   Temp (!) 96.6 F (35.9 C) (Temporal)   Ht 5' 5"  (1.651 m)   Wt 199 lb 8 oz (90.5 kg)   SpO2 97%   BMI 33.20 kg/m   Physical Exam  Constitutional: Leah King is oriented to person, place, and time. Leah King appears well-developed and well-nourished.  No apparent discomfort  HENT:  Head: Normocephalic and atraumatic.  Mouth/Throat: Oropharynx is clear and moist.  Eyes: Conjunctivae are normal. Pupils are equal, round, and reactive to light.  Neck: Normal range of motion.  Cardiovascular: Normal rate, regular rhythm and normal heart sounds.  Pulmonary/Chest: Effort normal and breath sounds normal.  Musculoskeletal: Normal range of motion.  Back appears straight and symmetric.  No tenderness over the lumbar muscles.  Mild tenderness centrally over L4-5.  No SI tenderness.  Strength sensation range of motion and reflexes are symmetric in both lower extremities with no focal neurologic findings  Neurological: Leah King is alert and oriented to person, place, and time. Leah King displays normal reflexes.  Psychiatric: Leah King behavior is normal.  Impetuous.  Impaired judgment.  Appears frustrated with pain control.    ASSESSMENT/PLAN:  1. Chronic midline low back pain without sciatica Unchanged  2. Essential hypertension Controlled  3. Controlled type 2 diabetes mellitus without complication, without long-term current use of insulin ([place]) Controlled by history.  Is overdue for laboratory confirmation - COMPLETE METABOLIC PANEL  WITH GFR - Hemoglobin A1c - CBC - Lipid panel - Urinalysis, Routine w reflex microscopic  4. Depression with anxiety Controlled  5. Screen for colon cancer Leah King request.  Leah King is appropriate for cologuard - Cologuard  6. Screening for breast cancer Ordered - MM Digital Screening; Future   Leah King Instructions  Leah King need to sign papers for a cologuard screen This is looking for colon cancer  Leah King have ordered a mammogram This [facility]  Need to come back for blood work.  This to check on your diabetes management.  Leah King have ordered new medicine for your back and body pain [facility]  Call the physical therapist for today and tell them Leah King are not well enough to attend.  Try to go on Friday  Leah King will need medical follow-up in 6 months.  We will let Leah King know about appointments.   Raylene Everts, MD

## 2017-04-06 ENCOUNTER — Encounter (HOSPITAL_COMMUNITY): Payer: Self-pay | Admitting: Physical Therapy

## 2017-04-06 ENCOUNTER — Ambulatory Visit (HOSPITAL_COMMUNITY): Payer: Medicare Other | Admitting: Physical Therapy

## 2017-04-06 ENCOUNTER — Other Ambulatory Visit: Payer: Self-pay

## 2017-04-06 DIAGNOSIS — M6281 Muscle weakness (generalized): Secondary | ICD-10-CM

## 2017-04-06 DIAGNOSIS — M5416 Radiculopathy, lumbar region: Secondary | ICD-10-CM | POA: Diagnosis not present

## 2017-04-06 NOTE — Patient Instructions (Addendum)
Functional Quadriceps: Chair Squat    Keeping feet flat on floor, shoulder width apart, squat as low as is comfortable. Use support as necessary. Repeat ____ times per set. Do ____ sets per session. Do ____ sessions per day.  http://orth.exer.us/736   Copyright  VHI. All rights reserved.  Functional Quadriceps: Sit to Stand    Sit on edge of chair, feet flat on floor. Stand upright, extending knees fully. Repeat 10____ times per set. Do ____ sets per session. Do ____ sessions per day.  http://orth.exer.us/734   Copyright  VHI. All rights reserved.  Heel Raise: Bilateral (Standing)    Rise on balls of feet. Repeat 10____ times per set. Do _1___ sets per session. Do _2___ sessions per day.  http://orth.exer.us/38   Copyright  VHI. All rights reserved.  Forward Lunge    Standing with feet shoulder width apart and stomach tight, step forward with left leg. Repeat __10__ times per set. Do _1___ sets per session. Do _2___ sessions per day.  http://orth.exer.us/1146   Copyright  VHI. All rights reserved.

## 2017-04-06 NOTE — Therapy (Signed)
[facility] [address] [place], [place], [address] Phone: [phone]   Fax:  [phone]  Physical Therapy Treatment  Patient Details  Name: Leah King MRN: [id number] Date of Birth: 1955-08-26 Referring Provider: Fuller Canada   Encounter Date: 04/06/2017  PT End of Session - 04/06/17 1016    Visit Number  4    Number of Visits  18    Date for PT Re-Evaluation  04/19/17    Authorization Type  medicare     Authorization - Visit Number  4    Authorization - Number of Visits  10    PT Start Time  0950    PT Stop Time  1030    PT Time Calculation (min)  40 min    Activity Tolerance  Patient tolerated treatment well    Behavior During Therapy  [facility] for tasks assessed/performed       Past Medical History:  Diagnosis Date  . Anxiety   . Arthritis    back and legs  . Depression   . Diabetes mellitus without complication (HCC)   . GERD (gastroesophageal reflux disease)   . Hyperlipidemia   . Hypertension     Past Surgical History:  Procedure Laterality Date  . SKIN GRAFT FULL THICKNESS ARM      There were no vitals filed for this visit.  Subjective Assessment - 04/06/17 0951    Subjective  Pt states that she went to the MD yesterday who gave her medication for her mm spasms.      Pertinent History  HTN, BP     Limitations  Standing;Walking    How long can you sit comfortably?  no problem     How long can you stand comfortably?  30 minutes     How long can you walk comfortably?  15 minutes     Patient Stated Goals  improved activity tolerance, decreased pain.     Currently in Pain?  Yes    Pain Score  8     Pain Location  Back    Pain Orientation  Lower    Pain Descriptors / Indicators  Aching    Pain Radiating Towards  B LE     Pain Onset  More than a month ago    Pain Frequency  Intermittent    Aggravating Factors   walking    Pain Relieving Factors  rest                      [facility] Adult PT  Treatment/Exercise - 04/06/17 0001      Exercises   Exercises  Lumbar      Lumbar Exercises: Stretches   Double Knee to Chest Stretch  3 reps;10 seconds    Pelvic Tilt  --      Lumbar Exercises: Standing   Heel Raises  10 reps    Functional Squats  10 reps      Lumbar Exercises: Seated   Sit to Stand  10 reps      Lumbar Exercises: Supine   Bent Knee Raise  10 reps    Bridge  15 reps    Other Supine Lumbar Exercises  pelvic tilt x 15       Lumbar Exercises: Sidelying   Hip Abduction  Both;15 reps      Lumbar Exercises: Prone   Straight Leg Raise  10 reps  PT Short Term Goals - 03/20/17 1411      PT SHORT TERM GOAL #1   Title  PT to demonstrate good sitting posture to decrease stress on lumbar spine.     Time  3    Period  Weeks    Status  New    Target Date  04/10/17      PT SHORT TERM GOAL #2   Title  Pt to demonstrate good body mechanics for activities such as lifting, pushing vacuum cleaner and reaching across the building.     Time  3    Period  Weeks    Status  New      PT SHORT TERM GOAL #3   Title  PT pain to be no greater than a 4/10 to allow pt to be able to sit in comfort for 30 minutes for traveling and having a meal.     Time  3    Period  Weeks    Status  New        PT Long Term Goals - 03/20/17 1414      PT LONG TERM GOAL #1   Title  PT strength in her core and LE to be increased by one grade to allow pt to be able to complete her housework without having to rest.     Time  6    Period  Weeks    Status  New    Target Date  05/08/17      PT LONG TERM GOAL #2   Title  Pt to be able to tolerate sitting for over an hour for socialization.     Time  6    Period  Weeks    Status  New      PT LONG TERM GOAL #3   Title  PT to be able to walk for at least an hour to return to her hobby of walking at least three times a week.     Time  6    Period  Weeks    Status  New            Plan - 04/06/17 1017     Clinical Impression Statement  Added standing stabilization exercises with significant verbal and manual cuing needed for correct form this will need reinforcing.  PT also needed significant cuing on reviewed exercises.      Rehab Potential  Good    PT Frequency  3x / week    PT Duration  6 weeks    PT Treatment/Interventions  ADLs/Self Care Home Management;Therapeutic activities;Therapeutic exercise;Patient/family education;Manual techniques    PT Next Visit Plan  Recommend not adding any new exercises until pt can complete current exercises with mod I.     PT Home Exercise Plan  Eval: bridge, posterior pelvic tilt, ab set; 03/28/17 - clamshell with band;     Consulted and Agree with Plan of Care  Patient       Patient will benefit from skilled therapeutic intervention in order to improve the following deficits and impairments:  Decreased activity tolerance, Difficulty walking, Decreased strength, Pain, Improper body mechanics, Postural dysfunction, Obesity  Visit Diagnosis: Radiculopathy, lumbar region  Muscle weakness (generalized)     Problem List Patient Active Problem List   Diagnosis Date Noted  . Controlled type 2 diabetes mellitus without complication, without long-term current use of insulin (HCC) 09/08/2016  . HLD (hyperlipidemia) 09/08/2016  . Essential hypertension 09/08/2016  . Environmental allergies 09/08/2016  . GERD  without esophagitis 09/08/2016  . Chronic back pain 09/08/2016  . Depression with anxiety 09/08/2016  . Insomnia 09/08/2016  Virgina Organynthia Cipriana Biller, PT CLT [phone] 04/06/2017, 10:28 AM  [facility] [address] [place], [place], [address] Phone: [phone]   Fax:  [phone]  Name: Edison NasutiDebra Madole MRN: [id number] Date of Birth: 11/17/1955

## 2017-04-09 ENCOUNTER — Ambulatory Visit (HOSPITAL_COMMUNITY): Payer: Medicare Other | Admitting: Physical Therapy

## 2017-04-09 ENCOUNTER — Telehealth (HOSPITAL_COMMUNITY): Payer: Self-pay | Admitting: Physical Therapy

## 2017-04-09 ENCOUNTER — Ambulatory Visit (HOSPITAL_COMMUNITY): Payer: Medicare Other

## 2017-04-09 NOTE — Telephone Encounter (Signed)
[facility] not [facility], per Ms.Environmental [facility]

## 2017-04-11 ENCOUNTER — Ambulatory Visit (HOSPITAL_COMMUNITY): Payer: Medicare Other | Admitting: Physical Therapy

## 2017-04-11 ENCOUNTER — Telehealth (HOSPITAL_COMMUNITY): Payer: Self-pay | Admitting: Family Medicine

## 2017-04-11 NOTE — Telephone Encounter (Signed)
04/11/17  she said they had to take her grandaughter to hospital over the weekend... she will be here at the next appt.

## 2017-04-13 ENCOUNTER — Ambulatory Visit (HOSPITAL_COMMUNITY): Payer: Medicare Other | Admitting: Physical Therapy

## 2017-04-13 ENCOUNTER — Encounter (HOSPITAL_COMMUNITY): Payer: Self-pay | Admitting: Physical Therapy

## 2017-04-13 DIAGNOSIS — M5416 Radiculopathy, lumbar region: Secondary | ICD-10-CM

## 2017-04-13 DIAGNOSIS — M6281 Muscle weakness (generalized): Secondary | ICD-10-CM

## 2017-04-13 NOTE — Therapy (Signed)
[facility] [address] [place], [place], [address] Phone: [phone]   Fax:  [phone]  Physical Therapy Treatment  Patient Details  Name: Leah King MRN: [id number] Date of Birth: 04/27/1955 Referring Provider: Fuller CanadaStan[place] Harrison   Encounter Date: 04/13/2017  No treatment to day.  PT is not feeling well and blood sugar is in the 80's. Pt advised to go home and have lunch As she has not had lunch today.  Past Medical History:  Diagnosis Date  . Anxiety   . Arthritis    back and legs  . Depression   . Diabetes mellitus without complication (HCC)   . GERD (gastroesophageal reflux disease)   . Hyperlipidemia   . Hypertension     Past Surgical History:  Procedure Laterality Date  . SKIN GRAFT FULL THICKNESS ARM      There were no vitals filed for this visit.  Subjective Assessment - 04/13/17 1435    Subjective  PT states that she feels jittery. Checked her blood sugar and it was 80. Therapist gave pt a brownie waited five minutes and blood sugar was at 86.  Pt states that she is not up to having therapy.     Pertinent History  HTN, BP     Limitations  Standing;Walking    How long can you sit comfortably?  no problem     How long can you stand comfortably?  30 minutes     How long can you walk comfortably?  15 minutes     Patient Stated Goals  improved activity tolerance, decreased pain.     Pain Onset  More than a month ago        No treatment       PT Short Term Goals - 03/20/17 1411      PT SHORT TERM GOAL #1   Title  PT to demonstrate good sitting posture to decrease stress on lumbar spine.     Time  3    Period  Weeks    Status  New    Target Date  04/10/17      PT SHORT TERM GOAL #2   Title  Pt to demonstrate good body mechanics for activities such as lifting, pushing vacuum cleaner and reaching across the building.     Time  3    Period  Weeks    Status  New      PT SHORT TERM GOAL #3   Title  PT  pain to be no greater than a 4/10 to allow pt to be able to sit in comfort for 30 minutes for traveling and having a meal.     Time  3    Period  Weeks    Status  New        PT Long Term Goals - 03/20/17 1414      PT LONG TERM GOAL #1   Title  PT strength in her core and LE to be increased by one grade to allow pt to be able to complete her housework without having to rest.     Time  6    Period  Weeks    Status  New    Target Date  05/08/17      PT LONG TERM GOAL #2   Title  Pt to be able to tolerate sitting for over an hour for socialization.     Time  6    Period  Weeks    Status  New      PT LONG TERM GOAL #3   Title  PT to be able to walk for at least an hour to return to her hobby of walking at least three times a week.     Time  6    Period  Weeks    Status  New              Patient will benefit from skilled therapeutic intervention in order to improve the following deficits and impairments:     Visit Diagnosis: Radiculopathy, lumbar region  Muscle weakness (generalized)     Problem List Patient Active Problem List   Diagnosis Date Noted  . Controlled type 2 diabetes mellitus without complication, without long-term current use of insulin (HCC) 09/08/2016  . HLD (hyperlipidemia) 09/08/2016  . Essential hypertension 09/08/2016  . Environmental allergies 09/08/2016  . GERD without esophagitis 09/08/2016  . Chronic back pain 09/08/2016  . Depression with anxiety 09/08/2016  . Insomnia 09/08/2016    Virgina Organ, PT CLT [phone] 04/13/2017, 2:56 PM  [facility] [address] [place], [place], [address] Phone: [phone]   Fax:  [phone]  Name: Leah King MRN: [id number] Date of Birth: [birth year]

## 2017-04-16 ENCOUNTER — Ambulatory Visit (HOSPITAL_COMMUNITY): Payer: Medicare Other | Admitting: Physical Therapy

## 2017-04-16 DIAGNOSIS — M5416 Radiculopathy, lumbar region: Secondary | ICD-10-CM

## 2017-04-16 DIAGNOSIS — M6281 Muscle weakness (generalized): Secondary | ICD-10-CM

## 2017-04-16 NOTE — Therapy (Signed)
[facility] [address] [place], [place], [address] Phone: [phone]   Fax:  [phone]  Physical Therapy Treatment  Patient Details  Name: Leah King MRN: [id number] Date of Birth: 01-25-1955 Referring Provider: Fuller Canada   Encounter Date: 04/16/2017  PT End of Session - 04/16/17 1343    Visit Number  5    Number of Visits  18    Date for PT Re-Evaluation  04/19/17    Authorization Type  medicare     Authorization - Visit Number  5    Authorization - Number of Visits  10    PT Start Time  1305    PT Stop Time  1345    PT Time Calculation (min)  40 min    Activity Tolerance  Patient tolerated treatment well    Behavior During Therapy  [facility] for tasks assessed/performed       Past Medical History:  Diagnosis Date  . Anxiety   . Arthritis    back and legs  . Depression   . Diabetes mellitus without complication (HCC)   . GERD (gastroesophageal reflux disease)   . Hyperlipidemia   . Hypertension     Past Surgical History:  Procedure Laterality Date  . SKIN GRAFT FULL THICKNESS ARM      There were no vitals filed for this visit.  Subjective Assessment - 04/16/17 1307    Subjective  Pt states her back feels fine today, her legs are hurting from arthritis.  States her sugar levels are good today.    Currently in Pain?  No/denies    Multiple Pain Sites  Yes    Pain Score  7    Pain Location  Leg    Pain Orientation  Right;Left    Pain Descriptors / Indicators  Aching                No data recorded       [facility] Adult PT Treatment/Exercise - 04/16/17 0001      Lumbar Exercises: Stretches   Single Knee to Chest Stretch  Left;Right;2 reps;30 seconds      Lumbar Exercises: Standing   Heel Raises  15 reps    Functional Squats  15 reps      Lumbar Exercises: Seated   Sit to Stand  10 reps;Limitations    Sit to Stand Limitations  no UE's      Lumbar Exercises: Supine   Bridge  15 reps    Straight Leg Raise  10 reps    Other Supine Lumbar Exercises  pelvic tilt x 15       Lumbar Exercises: Sidelying   Hip Abduction  Both;15 reps      Lumbar Exercises: Prone   Straight Leg Raise  15 reps               PT Short Term Goals - 03/20/17 1411      PT SHORT TERM GOAL #1   Title  PT to demonstrate good sitting posture to decrease stress on lumbar spine.     Time  3    Period  Weeks    Status  New    Target Date  04/10/17      PT SHORT TERM GOAL #2   Title  Pt to demonstrate good body mechanics for activities such as lifting, pushing vacuum cleaner and reaching across the building.     Time  3    Period  Weeks  Status  New      PT SHORT TERM GOAL #3   Title  PT pain to be no greater than a 4/10 to allow pt to be able to sit in comfort for 30 minutes for traveling and having a meal.     Time  3    Period  Weeks    Status  New        PT Long Term Goals - 03/20/17 1414      PT LONG TERM GOAL #1   Title  PT strength in her core and LE to be increased by one grade to allow pt to be able to complete her housework without having to rest.     Time  6    Period  Weeks    Status  New    Target Date  05/08/17      PT LONG TERM GOAL #2   Title  Pt to be able to tolerate sitting for over an hour for socialization.     Time  6    Period  Weeks    Status  New      PT LONG TERM GOAL #3   Title  PT to be able to walk for at least an hour to return to her hobby of walking at least three times a week.     Time  6    Period  Weeks    Status  New            Plan - 04/16/17 1343    Clinical Impression Statement  Completed all therex today with continued need for verbal and manual cues.  PT with difficulty keeping count of therex without therapist assist also.  Pt reported no increased pain or difficulty with any therex.  Able to increase reps of some therex today as well.   Pt reported fatigue at EOS.    Rehab Potential  Good    PT Frequency  3x / week     PT Duration  6 weeks    PT Treatment/Interventions  ADLs/Self Care Home Management;Therapeutic activities;Therapeutic exercise;Patient/family education;Manual techniques    PT Next Visit Plan  Recommend not adding any new exercises until pt can complete current exercises with increased independence.      PT Home Exercise Plan  Eval: bridge, posterior pelvic tilt, ab set; 03/28/17 - clamshell with band;     Consulted and Agree with Plan of Care  Patient       Patient will benefit from skilled therapeutic intervention in order to improve the following deficits and impairments:  Decreased activity tolerance, Difficulty walking, Decreased strength, Pain, Improper body mechanics, Postural dysfunction, Obesity  Visit Diagnosis: Radiculopathy, lumbar region  Muscle weakness (generalized)     Problem List Patient Active Problem List   Diagnosis Date Noted  . Controlled type 2 diabetes mellitus without complication, without long-term current use of insulin (HCC) 09/08/2016  . HLD (hyperlipidemia) 09/08/2016  . Essential hypertension 09/08/2016  . Environmental allergies 09/08/2016  . GERD without esophagitis 09/08/2016  . Chronic back pain 09/08/2016  . Depression with anxiety 09/08/2016  . Insomnia 09/08/2016   Lurena NidaAmy B Frazier, PTA/CLT [phone]  Lurena NidaFrazier, Amy B 04/16/2017, 1:46 PM  [facility] [address] [place], [place], [address] Phone: [phone]   Fax:  [phone]  Name: Leah King MRN: [id number] Date of Birth: 04/03/1955

## 2017-04-18 ENCOUNTER — Ambulatory Visit (HOSPITAL_COMMUNITY): Payer: Medicare Other | Admitting: Physical Therapy

## 2017-04-18 ENCOUNTER — Ambulatory Visit (HOSPITAL_COMMUNITY)
Admission: RE | Admit: 2017-04-18 | Discharge: 2017-04-18 | Disposition: A | Discharge status: Home / Self Care | Payer: Medicare Other | Source: Ambulatory Visit | Attending: Family Medicine | Admitting: Family Medicine

## 2017-04-18 DIAGNOSIS — M79674 Pain in right toe(s): Secondary | ICD-10-CM | POA: Diagnosis not present

## 2017-04-18 DIAGNOSIS — Z1239 Encounter for other screening for malignant neoplasm of breast: Secondary | ICD-10-CM

## 2017-04-18 DIAGNOSIS — M5416 Radiculopathy, lumbar region: Secondary | ICD-10-CM

## 2017-04-18 DIAGNOSIS — M79675 Pain in left toe(s): Secondary | ICD-10-CM | POA: Diagnosis not present

## 2017-04-18 DIAGNOSIS — Z1231 Encounter for screening mammogram for malignant neoplasm of breast: Secondary | ICD-10-CM | POA: Insufficient documentation

## 2017-04-18 DIAGNOSIS — M6281 Muscle weakness (generalized): Secondary | ICD-10-CM

## 2017-04-18 DIAGNOSIS — L851 Acquired keratosis [keratoderma] palmaris et plantaris: Secondary | ICD-10-CM | POA: Diagnosis not present

## 2017-04-18 DIAGNOSIS — B351 Tinea unguium: Secondary | ICD-10-CM | POA: Diagnosis not present

## 2017-04-18 DIAGNOSIS — E1151 Type 2 diabetes mellitus with diabetic peripheral angiopathy without gangrene: Secondary | ICD-10-CM | POA: Diagnosis not present

## 2017-04-18 NOTE — Therapy (Signed)
[facility] [address] [place], [place], [address] Phone: [phone]   Fax:  [phone]  Physical Therapy Treatment  Patient Details  Name: Leah King MRN: [id number] Date of Birth: [birth year] Referring Provider: Fuller Canada   Encounter Date: 04/18/2017  PT End of Session - 04/18/17 1134    Visit Number  6    Number of Visits  18    Date for PT Re-Evaluation  04/19/17    Authorization Type  medicare     Authorization - Visit Number  6    Authorization - Number of Visits  10    PT Start Time  1045    PT Stop Time  1130    PT Time Calculation (min)  45 min    Activity Tolerance  Patient tolerated treatment well    Behavior During Therapy  [facility] for tasks assessed/performed       Past Medical History:  Diagnosis Date  . Anxiety   . Arthritis    back and legs  . Depression   . Diabetes mellitus without complication (HCC)   . GERD (gastroesophageal reflux disease)   . Hyperlipidemia   . Hypertension     Past Surgical History:  Procedure Laterality Date  . SKIN GRAFT FULL THICKNESS ARM      There were no vitals filed for this visit.  Subjective Assessment - 04/18/17 1113    Subjective  Pt in waiting room, late getting back as she did not know to sign in.  Pt states she felt better after last session but now the pain has returned in her legs.  reports anteriorly entire length of LE at 8/10.  STates more pain with weightbearing, less with sitting.      Currently in Pain?  No/denies    Multiple Pain Sites  Yes    Pain Score  8    Pain Location  Leg    Pain Orientation  Right;Left;Anterior    Pain Descriptors / Indicators  Aching;Shooting;Sore                No data recorded       [facility] Adult PT Treatment/Exercise - 04/18/17 0001      Lumbar Exercises: Stretches   Active Hamstring Stretch  Right;Left;2 reps;20 seconds;Limitations    Active Hamstring Stretch Limitations  with towel behind knee     Single Knee to Chest Stretch  Left;Right;30 seconds;3 reps    Single Knee to Chest Stretch Limitations  with towel      Lumbar Exercises: Standing   Heel Raises  15 reps    Functional Squats  15 reps    Forward Lunge  10 reps;Limitations    Forward Lunge Limitations  onto 4", pt had to use UE's due to instability and complete in correct form      Lumbar Exercises: Seated   Sit to Stand  10 reps;Limitations    Sit to Stand Limitations  no UE's      Lumbar Exercises: Supine   Pelvic Tilt  15 reps    Bridge  20 reps    Straight Leg Raise  15 reps      Lumbar Exercises: Sidelying   Hip Abduction  Both;15 reps               PT Short Term Goals - 03/20/17 1411      PT SHORT TERM GOAL #1   Title  PT to demonstrate good sitting posture to decrease stress on  lumbar spine.     Time  3    Period  Weeks    Status  New    Target Date  04/10/17      PT SHORT TERM GOAL #2   Title  Pt to demonstrate good body mechanics for activities such as lifting, pushing vacuum cleaner and reaching across the building.     Time  3    Period  Weeks    Status  New      PT SHORT TERM GOAL #3   Title  PT pain to be no greater than a 4/10 to allow pt to be able to sit in comfort for 30 minutes for traveling and having a meal.     Time  3    Period  Weeks    Status  New        PT Long Term Goals - 03/20/17 1414      PT LONG TERM GOAL #1   Title  PT strength in her core and LE to be increased by one grade to allow pt to be able to complete her housework without having to rest.     Time  6    Period  Weeks    Status  New    Target Date  05/08/17      PT LONG TERM GOAL #2   Title  Pt to be able to tolerate sitting for over an hour for socialization.     Time  6    Period  Weeks    Status  New      PT LONG TERM GOAL #3   Title  PT to be able to walk for at least an hour to return to her hobby of walking at least three times a week.     Time  6    Period  Weeks    Status  New             Plan - 04/18/17 1134    Clinical Impression Statement  Continued need for cues for form and therex holds/counts.  Pt had to be excused during session to use restroom and reported irritation in LE's during session, having to rest between each standing exercise.  Added lunges today with need to complete with UE assist as pateint completed in poor form and dififculty maintaining stabiltiy/pain reduction.  Pt reported no change in pain level at EOS.    Rehab Potential  Good    PT Frequency  3x / week    PT Duration  6 weeks    PT Treatment/Interventions  ADLs/Self Care Home Management;Therapeutic activities;Therapeutic exercise;Patient/family education;Manual techniques    PT Next Visit Plan  Recommend not adding any new exercises to HEP until pt can complete current exercises with increased independence.   PRogress LE strengthening exercises to pain tolerance.    PT Home Exercise Plan  Eval: bridge, posterior pelvic tilt, ab set; 03/28/17 - clamshell with band;     Consulted and Agree with Plan of Care  Patient       Patient will benefit from skilled therapeutic intervention in order to improve the following deficits and impairments:  Decreased activity tolerance, Difficulty walking, Decreased strength, Pain, Improper body mechanics, Postural dysfunction, Obesity  Visit Diagnosis: Radiculopathy, lumbar region  Muscle weakness (generalized)     Problem List Patient Active Problem List   Diagnosis Date Noted  . Controlled type 2 diabetes mellitus without complication, without long-term current use of insulin (HCC) 09/08/2016  .  HLD (hyperlipidemia) 09/08/2016  . Essential hypertension 09/08/2016  . Environmental allergies 09/08/2016  . GERD without esophagitis 09/08/2016  . Chronic back pain 09/08/2016  . Depression with anxiety 09/08/2016  . Insomnia 09/08/2016   Lurena NidaAmy B Frazier, PTA/CLT [phone]  Lurena NidaFrazier, Amy B 04/18/2017, 12:12 PM   [facility] [address] [place], [place], [address] Phone: [phone]   Fax:  [phone]  Name: Leah King MRN: [id number] Date of Birth: 08/20/1955

## 2017-04-19 ENCOUNTER — Ambulatory Visit: Payer: Medicare Other | Admitting: Family Medicine

## 2017-04-20 ENCOUNTER — Ambulatory Visit (HOSPITAL_COMMUNITY): Payer: Medicare Other | Admitting: Physical Therapy

## 2017-04-20 ENCOUNTER — Encounter (HOSPITAL_COMMUNITY): Payer: Self-pay | Admitting: Physical Therapy

## 2017-04-20 DIAGNOSIS — M6281 Muscle weakness (generalized): Secondary | ICD-10-CM | POA: Diagnosis not present

## 2017-04-20 DIAGNOSIS — M5416 Radiculopathy, lumbar region: Secondary | ICD-10-CM

## 2017-04-20 NOTE — Therapy (Signed)
[facility] [address] [place], [place], [address] Phone: [phone]   Fax:  [phone]  Physical Therapy Treatment  Patient Details  Name: Leah King MRN: [id number] Date of Birth: 29-Dec-1955 Referring Provider: Fuller Canada   Encounter Date: 04/20/2017  PT End of Session - 04/20/17 1302    Visit Number  7    Number of Visits  18    Date for PT Re-Evaluation  04/19/17    Authorization Type  medicare     Authorization - Visit Number  7    Authorization - Number of Visits  10    PT Start Time  1302    PT Stop Time  1343    PT Time Calculation (min)  41 min    Activity Tolerance  Patient tolerated treatment well    Behavior During Therapy  [facility] for tasks assessed/performed       Past Medical History:  Diagnosis Date  . Anxiety   . Arthritis    back and legs  . Depression   . Diabetes mellitus without complication (HCC)   . GERD (gastroesophageal reflux disease)   . Hyperlipidemia   . Hypertension     Past Surgical History:  Procedure Laterality Date  . SKIN GRAFT FULL THICKNESS ARM      There were no vitals filed for this visit.  Subjective Assessment - 04/20/17 1302    Subjective  Pt states that her back is doing pretty well but both her legs are aching from mid thigh down to her feet.     Pertinent History  HTN, BP     Limitations  Standing;Walking    How long can you sit comfortably?  no problem     How long can you stand comfortably?  30 minutes     How long can you walk comfortably?  15 minutes     Patient Stated Goals  improved activity tolerance, decreased pain.     Currently in Pain?  Yes    Pain Score  7     Pain Location  Leg    Pain Orientation  Right;Left    Pain Onset  More than a month ago                No data recorded       [facility] Adult PT Treatment/Exercise - 04/20/17 0001      Exercises   Exercises  Lumbar      Lumbar Exercises: Stretches   Single Knee to Chest  Stretch  Left;Right;30 seconds;3 reps    Single Knee to Chest Stretch Limitations  --    Pelvic Tilt  10 reps      Lumbar Exercises: Standing   Heel Raises  15 reps    Functional Squats  15 reps    Forward Lunge  10 reps;Limitations      Lumbar Exercises: Seated   Sit to Stand  10 reps;Limitations      Lumbar Exercises: Supine   Bent Knee Raise  10 reps    Bridge  15 reps    Other Supine Lumbar Exercises  --      Lumbar Exercises: Sidelying   Hip Abduction  Both;15 reps      Lumbar Exercises: Quadruped   Madcat/Old Horse  5 reps      Manual Therapy   Manual Therapy  Joint mobilization;Soft tissue mobilization    Manual therapy comments  done seperate from all other apects of treatment  Joint Mobilization  distration of B hips x 15" x 6; jt mob to lower thoracic and lumbar B grade II/III    Soft tissue mobilization  petrissage to B paraspinal mm to decrease tightness.               PT Short Term Goals - 03/20/17 1411      PT SHORT TERM GOAL #1   Title  PT to demonstrate good sitting posture to decrease stress on lumbar spine.     Time  3    Period  Weeks    Status  New    Target Date  04/10/17      PT SHORT TERM GOAL #2   Title  Pt to demonstrate good body mechanics for activities such as lifting, pushing vacuum cleaner and reaching across the building.     Time  3    Period  Weeks    Status  New      PT SHORT TERM GOAL #3   Title  PT pain to be no greater than a 4/10 to allow pt to be able to sit in comfort for 30 minutes for traveling and having a meal.     Time  3    Period  Weeks    Status  New        PT Long Term Goals - 03/20/17 1414      PT LONG TERM GOAL #1   Title  PT strength in her core and LE to be increased by one grade to allow pt to be able to complete her housework without having to rest.     Time  6    Period  Weeks    Status  New    Target Date  05/08/17      PT LONG TERM GOAL #2   Title  Pt to be able to tolerate sitting for  over an hour for socialization.     Time  6    Period  Weeks    Status  New      PT LONG TERM GOAL #3   Title  PT to be able to walk for at least an hour to return to her hobby of walking at least three times a week.     Time  6    Period  Weeks    Status  New            Plan - 04/20/17 1342    Clinical Impression Statement  PT complaining of B LE pain.  Distraction of hip decreased leg pan. Noted decreased jt motion which was improved with manual techniques.  Added mad cat exercises to improve lumbar flexibility.     Rehab Potential  Good    PT Frequency  3x / week    PT Duration  6 weeks    PT Treatment/Interventions  ADLs/Self Care Home Management;Therapeutic activities;Therapeutic exercise;Patient/family education;Manual techniques    PT Next Visit Plan  Recommend not adding any new exercises to HEP until pt can complete current exercises with increased independence.   Assess how manual affected pt leg pain     PT Home Exercise Plan  Eval: bridge, posterior pelvic tilt, ab set; 03/28/17 - clamshell with band;     Consulted and Agree with Plan of Care  Patient       Patient will benefit from skilled therapeutic intervention in order to improve the following deficits and impairments:  Decreased activity tolerance, Difficulty walking, Decreased strength, Pain, Improper  body mechanics, Postural dysfunction, Obesity  Visit Diagnosis: Radiculopathy, lumbar region  Muscle weakness (generalized)     Problem List Patient Active Problem List   Diagnosis Date Noted  . Controlled type 2 diabetes mellitus without complication, without long-term current use of insulin (HCC) 09/08/2016  . HLD (hyperlipidemia) 09/08/2016  . Essential hypertension 09/08/2016  . Environmental allergies 09/08/2016  . GERD without esophagitis 09/08/2016  . Chronic back pain 09/08/2016  . Depression with anxiety 09/08/2016  . Insomnia 09/08/2016  Leah King, PT CLT [phone] 04/20/2017, 1:45  PM  [facility] [address] [place], [place], [address] Phone: [phone]   Fax:  [phone]  Name: Leah King MRN: [id number] Date of Birth: 09/20/1955

## 2017-04-23 DIAGNOSIS — E119 Type 2 diabetes mellitus without complications: Secondary | ICD-10-CM | POA: Diagnosis not present

## 2017-04-24 ENCOUNTER — Encounter (HOSPITAL_COMMUNITY): Payer: Medicare Other

## 2017-04-24 ENCOUNTER — Encounter: Payer: Self-pay | Admitting: Family Medicine

## 2017-04-24 LAB — LIPID PANEL
CHOL/HDL RATIO: 3.2 (calc) (ref <5.0)
Cholesterol: 173 mg/dL (ref <200)
HDL: 54 mg/dL (ref >50)
LDL Cholesterol (Calc): 92 mg/dL (calc)
NON-HDL CHOLESTEROL (CALC): 119 mg/dL (ref <130)
Triglycerides: 160 mg/dL — ABNORMAL HIGH (ref <150)

## 2017-04-24 LAB — CBC
HEMATOCRIT: 37.3 % (ref 35.0–45.0)
HEMOGLOBIN: 12.6 g/dL (ref 11.7–15.5)
MCH: 31 pg (ref 27.0–33.0)
MCHC: 33.8 g/dL (ref 32.0–36.0)
MCV: 91.9 fL (ref 80.0–100.0)
MPV: 12.2 fL (ref 7.5–12.5)
Platelets: 203 10*3/uL (ref 140–400)
RBC: 4.06 10*6/uL (ref 3.80–5.10)
RDW: 11.9 % (ref 11.0–15.0)
WBC: 4 10*3/uL (ref 3.8–10.8)

## 2017-04-24 LAB — COMPLETE METABOLIC PANEL WITH GFR
AG Ratio: 1.6 (calc) (ref 1.0–2.5)
ALBUMIN MSPROF: 4.1 g/dL (ref 3.6–5.1)
ALT: 38 U/L — ABNORMAL HIGH (ref 6–29)
AST: 34 U/L (ref 10–35)
Alkaline phosphatase (APISO): 99 U/L (ref 33–130)
BUN: 12 mg/dL (ref 7–25)
CALCIUM: 9.4 mg/dL (ref 8.6–10.4)
CO2: 27 mmol/L (ref 20–32)
CREATININE: 0.95 mg/dL (ref 0.50–0.99)
Chloride: 111 mmol/L — ABNORMAL HIGH (ref 98–110)
GFR, EST AFRICAN AMERICAN: 74 mL/min/{1.73_m2} (ref >=60)
GFR, EST NON AFRICAN AMERICAN: 64 mL/min/{1.73_m2} (ref >=60)
GLOBULIN: 2.6 g/dL (ref 1.9–3.7)
Glucose, Bld: 148 mg/dL — ABNORMAL HIGH (ref 65–139)
Potassium: 4.3 mmol/L (ref 3.5–5.3)
SODIUM: 143 mmol/L (ref 135–146)
TOTAL PROTEIN: 6.7 g/dL (ref 6.1–8.1)
Total Bilirubin: 0.4 mg/dL (ref 0.2–1.2)

## 2017-04-24 LAB — HEMOGLOBIN A1C
EAG (MMOL/L): 7.4 (calc)
HEMOGLOBIN A1C: 6.3 %{Hb} — AB (ref <5.7)
MEAN PLASMA GLUCOSE: 134 (calc)

## 2017-04-25 ENCOUNTER — Ambulatory Visit (HOSPITAL_COMMUNITY): Payer: Medicare Other | Attending: Orthopedic Surgery | Admitting: Physical Therapy

## 2017-04-25 ENCOUNTER — Ambulatory Visit: Payer: Medicare Other | Admitting: Orthopedic Surgery

## 2017-04-25 ENCOUNTER — Telehealth: Payer: Self-pay | Admitting: Family Medicine

## 2017-04-25 DIAGNOSIS — M5416 Radiculopathy, lumbar region: Secondary | ICD-10-CM | POA: Insufficient documentation

## 2017-04-25 DIAGNOSIS — M6281 Muscle weakness (generalized): Secondary | ICD-10-CM | POA: Diagnosis not present

## 2017-04-25 NOTE — Telephone Encounter (Signed)
LVM for the patient to call the office to be seen per Dr Delton SeeNelson, for her Therapeutic Shoes.

## 2017-04-25 NOTE — Therapy (Signed)
[facility] [address] [place], [place], [address] Phone: [phone]   Fax:  [phone]  Physical Therapy Treatment  Patient Details  Name: Leah King MRN: [id number] Date of Birth: [birth year] Referring Provider: Fuller Canada   Encounter Date: 04/25/2017  PT End of Session - 04/25/17 1607    Visit Number  8    Number of Visits  18    Date for PT Re-Evaluation  04/19/17    Authorization Type  medicare     Authorization - Visit Number  8    Authorization - Number of Visits  10    PT Start Time  1521    PT Stop Time  1602    PT Time Calculation (min)  41 min    Activity Tolerance  Patient tolerated treatment well    Behavior During Therapy  [facility] for tasks assessed/performed       Past Medical History:  Diagnosis Date  . Anxiety   . Arthritis    back and legs  . Depression   . Diabetes mellitus without complication (HCC)   . GERD (gastroesophageal reflux disease)   . Hyperlipidemia   . Hypertension     Past Surgical History:  Procedure Laterality Date  . SKIN GRAFT FULL THICKNESS ARM      There were no vitals filed for this visit.  Subjective Assessment - 04/25/17 1527    Subjective  Pt states her legs hurt her really bad. States her back is feeling alot better and currently without pain.  States she's been rubbing her LB with green alcohol.   States she is walking alot for exercise.  States she has appt with MD on 4/15 to see about getting some diabetic shoes.     Currently in Pain?  Yes    Pain Score  7     Pain Location  Leg    Pain Orientation  Right;Left    Pain Descriptors / Indicators  Aching    Pain Type  Chronic pain    Pain Radiating Towards  Bil LE's                       [facility] Adult PT Treatment/Exercise - 04/25/17 0001      Lumbar Exercises: Standing   Heel Raises  15 reps    Functional Squats  15 reps    Forward Lunge  Limitations;15 reps      Lumbar Exercises: Seated   Sit  to Stand  10 reps;Limitations    Sit to Stand Limitations  no UE's      Lumbar Exercises: Supine   Bent Knee Raise  10 reps    Bridge  15 reps    Straight Leg Raise  15 reps      Manual Therapy   Manual Therapy  Joint mobilization;Soft tissue mobilization    Manual therapy comments  done seperate from all other apects of treatment    Joint Mobilization  distration of B hips x 15" x 6; jt mob to lower thoracic and lumbar B grade II/III    Soft tissue mobilization  petrissage to B paraspinal mm to decrease tightness.               PT Short Term Goals - 03/20/17 1411      PT SHORT TERM GOAL #1   Title  PT to demonstrate good sitting posture to decrease stress on lumbar spine.     Time  3  Period  Weeks    Status  New    Target Date  04/10/17      PT SHORT TERM GOAL #2   Title  Pt to demonstrate good body mechanics for activities such as lifting, pushing vacuum cleaner and reaching across the building.     Time  3    Period  Weeks    Status  New      PT SHORT TERM GOAL #3   Title  PT pain to be no greater than a 4/10 to allow pt to be able to sit in comfort for 30 minutes for traveling and having a meal.     Time  3    Period  Weeks    Status  New        PT Long Term Goals - 03/20/17 1414      PT LONG TERM GOAL #1   Title  PT strength in her core and LE to be increased by one grade to allow pt to be able to complete her housework without having to rest.     Time  6    Period  Weeks    Status  New    Target Date  05/08/17      PT LONG TERM GOAL #2   Title  Pt to be able to tolerate sitting for over an hour for socialization.     Time  6    Period  Weeks    Status  New      PT LONG TERM GOAL #3   Title  PT to be able to walk for at least an hour to return to her hobby of walking at least three times a week.     Time  6    Period  Weeks    Status  New            Plan - 04/25/17 1608    Clinical Impression Statement  Pt with continued B LE pain,  however states the manual completed last session did help.  Continued with established therex noting patient needed less cues as previously to complete in correct form.  continued with manual at end of session with most tenderness voiced in rt gluteal region.  General tightness palpated without spasm.      Rehab Potential  Good    PT Frequency  3x / week    PT Duration  6 weeks    PT Treatment/Interventions  ADLs/Self Care Home Management;Therapeutic activities;Therapeutic exercise;Patient/family education;Manual techniques    PT Next Visit Plan  Add more exercises to HEP next session.  Progress strength/stab and continue manual PRN.    PT Home Exercise Plan  Eval: bridge, posterior pelvic tilt, ab set; 03/28/17 - clamshell with band;     Consulted and Agree with Plan of Care  Patient       Patient will benefit from skilled therapeutic intervention in order to improve the following deficits and impairments:  Decreased activity tolerance, Difficulty walking, Decreased strength, Pain, Improper body mechanics, Postural dysfunction, Obesity  Visit Diagnosis: Radiculopathy, lumbar region  Muscle weakness (generalized)     Problem List Patient Active Problem List   Diagnosis Date Noted  . Controlled type 2 diabetes mellitus without complication, without long-term current use of insulin (HCC) 09/08/2016  . HLD (hyperlipidemia) 09/08/2016  . Essential hypertension 09/08/2016  . Environmental allergies 09/08/2016  . GERD without esophagitis 09/08/2016  . Chronic back pain 09/08/2016  . Depression with anxiety 09/08/2016  . Insomnia  09/08/2016   Lurena Nida, PTA/CLT [phone]  Lurena Nida 04/25/2017, 4:11 PM  [facility] [address] [place], [place], [address] Phone: [phone]   Fax:  [phone]  Name: Leah King MRN: [id number] Date of Birth: [birth year]

## 2017-04-26 ENCOUNTER — Telehealth (HOSPITAL_COMMUNITY): Payer: Self-pay | Admitting: Family Medicine

## 2017-04-26 ENCOUNTER — Encounter (HOSPITAL_COMMUNITY): Payer: Medicare Other

## 2017-04-26 NOTE — Telephone Encounter (Signed)
04/26/17  I called to see if patient could move the 1:45 appt to 2;30... She did but was also going to confirm with her Child psychotherapistsocial worker.

## 2017-04-30 ENCOUNTER — Telehealth (HOSPITAL_COMMUNITY): Payer: Self-pay

## 2017-04-30 ENCOUNTER — Ambulatory Visit (HOSPITAL_COMMUNITY): Payer: Medicare Other

## 2017-04-30 NOTE — Telephone Encounter (Signed)
Patient had to cancel so that she may go see her social worker about getting custody of her grandchild and will call us later to see about making another appt for this week.

## 2017-05-02 ENCOUNTER — Encounter (HOSPITAL_COMMUNITY): Payer: Medicare Other | Admitting: Physical Therapy

## 2017-05-03 ENCOUNTER — Encounter (HOSPITAL_COMMUNITY): Payer: Medicare Other | Admitting: Physical Therapy

## 2017-05-07 ENCOUNTER — Ambulatory Visit (INDEPENDENT_AMBULATORY_CARE_PROVIDER_SITE_OTHER): Payer: Medicare Other | Admitting: Orthopedic Surgery

## 2017-05-07 ENCOUNTER — Encounter: Payer: Self-pay | Admitting: Orthopedic Surgery

## 2017-05-07 VITALS — BP 149/90 | HR 90 | Ht 65.0 in | Wt 199.0 lb

## 2017-05-07 DIAGNOSIS — M5136 Other intervertebral disc degeneration, lumbar region: Secondary | ICD-10-CM

## 2017-05-07 NOTE — Progress Notes (Signed)
Back pain back pain times 3 years  HPI Leah King is a 62 y.o. female. 62 years old presents with 3-year history of back pain  She has not had any treatment although she is on Elavil gabapentin and naproxen for other reasons.  She says he had arthritis and was treated for that but her doctor passed away  She is referred to us from Dr. Delton King x-rays show degenerative disc disease throughout the lumbar spine  She has bilateral leg pain which is constant moderate in severity dull aching pain shoulder pain arm pain and again of course lower back pain no injury or trauma no prior treatment   Review of Systems Review of Systems  Gastrointestinal: Negative.   Genitourinary: Negative.   Musculoskeletal: Positive for back pain, joint swelling and myalgias.  Neurological: Negative for weakness and numbness.    She is doing much better with her back pain but she still having bilateral leg pain after therapy.  She is still undergoing physical therapy she is on Flexeril and gabapentin  Physical Exam  Constitutional: She is oriented to person, place, and time. She appears well-developed and well-nourished.  Neurological: She is alert and oriented to person, place, and time.  Psychiatric: She has a normal mood and affect. Judgment normal.  Vitals reviewed. Right and left leg strength is normal bilaterally. Negative seated straight leg raises  Encounter Diagnosis  Name Primary?  . DDD (degenerative disc disease), lumbar Yes    Continue therapy, Flexeril, gabapentin  Follow-up 1 year repeat at back x-ray

## 2017-05-08 ENCOUNTER — Encounter (HOSPITAL_COMMUNITY): Payer: Medicare Other | Admitting: Physical Therapy

## 2017-05-09 ENCOUNTER — Other Ambulatory Visit: Payer: Self-pay | Admitting: Family Medicine

## 2017-05-09 ENCOUNTER — Ambulatory Visit (HOSPITAL_COMMUNITY): Payer: Medicare Other

## 2017-05-09 ENCOUNTER — Encounter (HOSPITAL_COMMUNITY): Payer: Self-pay

## 2017-05-09 DIAGNOSIS — M6281 Muscle weakness (generalized): Secondary | ICD-10-CM | POA: Diagnosis not present

## 2017-05-09 DIAGNOSIS — M5416 Radiculopathy, lumbar region: Secondary | ICD-10-CM

## 2017-05-09 NOTE — Therapy (Signed)
[place], [place], [address] Phone: [phone]   Fax:  [phone]    Progress Note Reporting Period 03/20/17 to 05/09/17  See note below for Objective Data and Assessment of Progress/Goals.     Physical Therapy Treatment/Reassessment  Patient Details  Name: Leah King MRN: [id number] Date of Birth: 1955-02-27 Referring Provider: Arther Abbott, MD   Encounter Date: 05/09/2017  PT End of Session - 05/09/17 0937    Visit Number  9    Number of Visits  18    Date for PT Re-Evaluation  05/30/17    Authorization Type  medicare     Authorization Time Period  NEW: 05/09/17 to 05/30/17    Authorization - Visit Number  9    Authorization - Number of Visits  10    PT Start Time  [phone]    PT Stop Time  1018    PT Time Calculation (min)  40 min    Activity Tolerance  Patient tolerated treatment well    Behavior During Therapy  [facility] for tasks assessed/performed       Past Medical History:  Diagnosis Date  . Anxiety   . Arthritis    back and legs  . Depression   . Diabetes mellitus without complication ([place])   . GERD (gastroesophageal reflux disease)   . Hyperlipidemia   . Hypertension     Past Surgical History:  Procedure Laterality Date  . SKIN GRAFT FULL THICKNESS ARM      There were no vitals filed for this visit.  Subjective Assessment - 05/09/17 0938    Subjective  Pt states that she saw Dr. Aline Brochure yesterday and he told her that her legs still need some therapy but her back does not need operating on.    Currently in Pain?  Yes    Pain Score  8     Pain Location  Leg    Pain Orientation  Right;Left    Pain Descriptors / Indicators  Aching    Pain Type  Chronic pain    Pain Onset  More than a month ago    Pain Frequency  Intermittent    Aggravating Factors   walking    Pain Relieving Factors  rest    Effect of Pain on Daily Activities  decreases         [facility] PT Assessment - 05/09/17  0001      Assessment   Medical Diagnosis  Lumbar radiculopathy    Referring Provider  Arther Abbott, MD    Onset Date/Surgical Date  02/28/17    Next MD Visit  f/u in 1 year    Prior Therapy  long ago       Observation/Other Assessments   Focus on Therapeutic Outcomes (FOTO)   35% limited      Functional Tests   Functional tests  Other;Other2      Other:   Other/ Comments  lifting 10# floor to chest: cues for proper form, hip pain with this      Other:   Other/Comments  vacuuming: cues for proper form      Strength   Right Hip Flexion  4+/5 was 3    Right Hip Extension  4-/5 was 3+    Right Hip ABduction  4-/5 was 3    Left Hip Flexion  4+/5 was 3    Left Hip Extension  4-/5 was 3+    Left Hip ABduction  4-/5 was  3            [facility] Adult PT Treatment/Exercise - 05/09/17 0001      Lumbar Exercises: Standing   Other Standing Lumbar Exercises  bil hip abd with RTB x10 reps each    Other Standing Lumbar Exercises  bil hip ext with RTB x10 reps each      Lumbar Exercises: Seated   Sit to Stand  15 reps    Sit to Stand Limitations  no UE's      Lumbar Exercises: Sidelying   Hip Abduction  Both;15 reps           PT Education - 05/09/17 0937    Education provided  Yes    Education Details  reassessment findings    Person(s) Educated  Patient    Methods  Explanation;Demonstration    Comprehension  Verbalized understanding;Returned demonstration       PT Short Term Goals - 05/09/17 0942      PT SHORT TERM GOAL #1   Title  PT to demonstrate good sitting posture to decrease stress on lumbar spine.     Baseline  4/17: pt in appropriate posture while sitting but still has some LBP and BLE pain with sitting    Time  3    Period  Weeks    Status  Partially Met      PT SHORT TERM GOAL #2   Title  Pt to demonstrate good body mechanics for activities such as lifting, pushing vacuum cleaner and reaching across the building.     Baseline  4/17: cues for proper  technique throughout all    Time  3    Period  Weeks    Status  On-going      PT SHORT TERM GOAL #3   Title  Pt pain to be no greater than a 4/10 to allow pt to be able to sit in comfort for 30 minutes for traveling and having a meal.     Baseline  4/17: can sit for 30 mins easily without pain    Time  3    Period  Weeks    Status  Achieved        PT Long Term Goals - 05/09/17 0944      PT LONG TERM GOAL #1   Title  PT strength in her core and LE to be increased by one grade to allow pt to be able to complete her housework without having to rest.     Baseline  4/17: see MMT    Time  6    Period  Weeks    Status  Partially Met      PT LONG TERM GOAL #2   Title  Pt to be able to tolerate sitting for over an hour for socialization.     Baseline  4/17: can sit for an hour more times that not    Time  6    Period  Weeks    Status  Partially Met      PT LONG TERM GOAL #3   Title  PT to be able to walk for at least an hour to return to her hobby of walking at least three times a week.     Baseline  4/17: states she can walk for an hour, but sometimes she has pain, sometimes she doesn't    Time  6    Period  Weeks    Status  Partially Met  Plan - 05/09/17 1017    Clinical Impression Statement  PT reassessed pt's goals and outcome measures this date. Pt has made good progress towards goals as illustrated above. Overall, she feels she has improved as she state her back doesn't hurt like it used to, it's mainly her BLE pain that is her limiting factor. Her proximal hip strength has improved though it is still deficient and likely leading to her bil hip pain. PT recommends extending time frame to cover remaining approved visits from initial POC in order to continue to address BLE pain and hip weakness in order to maximize her overall function and QOL, while addressing LBP PRN. PT updated pt's hip strengthening HEP this date and she was able to demo and verbalize  understanding.     Rehab Potential  Good    PT Frequency  3x / week    PT Duration  3 weeks    PT Treatment/Interventions  ADLs/Self Care Home Management;Therapeutic activities;Therapeutic exercise;Patient/family education;Manual techniques    PT Next Visit Plan  continue and progress hip/functional strengthening; Progress strength/stab and continue manual PRN.    PT Home Exercise Plan  Eval: bridge, posterior pelvic tilt, ab set; 03/28/17 - clamshell with band; 4/17: sidelying hip abd, STS, st hip abd and hip ext with RTB    Consulted and Agree with Plan of Care  Patient       Patient will benefit from skilled therapeutic intervention in order to improve the following deficits and impairments:  Decreased activity tolerance, Difficulty walking, Decreased strength, Pain, Improper body mechanics, Postural dysfunction, Obesity  Visit Diagnosis: Radiculopathy, lumbar region - Plan: PT plan of care cert/re-cert  Muscle weakness (generalized) - Plan: PT plan of care cert/re-cert     Problem List Patient Active Problem List   Diagnosis Date Noted  . Controlled type 2 diabetes mellitus without complication, without long-term current use of insulin ([place]) 09/08/2016  . HLD (hyperlipidemia) 09/08/2016  . Essential hypertension 09/08/2016  . Environmental allergies 09/08/2016  . GERD without esophagitis 09/08/2016  . Chronic back pain 09/08/2016  . Depression with anxiety 09/08/2016  . Insomnia 09/08/2016      Geraldine Solar PT, DPT  [place] [address] [place], [place], [address] Phone: [phone]   Fax:  [phone]  Name: Leah King MRN: [id number] Date of Birth: [birth year]

## 2017-05-10 ENCOUNTER — Other Ambulatory Visit: Payer: Self-pay

## 2017-05-10 ENCOUNTER — Encounter: Payer: Self-pay | Admitting: Family Medicine

## 2017-05-10 ENCOUNTER — Encounter (HOSPITAL_COMMUNITY): Payer: Self-pay

## 2017-05-10 ENCOUNTER — Ambulatory Visit (HOSPITAL_COMMUNITY): Payer: Medicare Other | Admitting: Physical Therapy

## 2017-05-10 ENCOUNTER — Ambulatory Visit (INDEPENDENT_AMBULATORY_CARE_PROVIDER_SITE_OTHER): Payer: Medicare Other | Admitting: Family Medicine

## 2017-05-10 VITALS — BP 158/98 | HR 95 | Temp 98.6°F | Resp 14 | Ht 65.0 in | Wt 199.1 lb

## 2017-05-10 DIAGNOSIS — E119 Type 2 diabetes mellitus without complications: Secondary | ICD-10-CM | POA: Diagnosis not present

## 2017-05-10 DIAGNOSIS — I10 Essential (primary) hypertension: Secondary | ICD-10-CM

## 2017-05-10 MED ORDER — LOSARTAN POTASSIUM-HCTZ 100-25 MG PO TABS: 1.0000 | ORAL_TABLET | Freq: Every day | ORAL | 3 refills | Status: DC

## 2017-05-10 NOTE — Therapy (Unsigned)
Pt showed for appt complaining of not feeling good.   States she went to the MD today and her BP was up.  States MD changed her BP med and she has not picked it up yet.  BP seated 195/105 mmHg.  Discussed with therapist (BP) and decided to hold therapy today.    No treatment today  Lurena Nidamy B Janeth Terry, PTA/CLT [phone]

## 2017-05-10 NOTE — Progress Notes (Signed)
Chief Complaint  Patient presents with  . diabetic shoe exam   Patient is here for a diabetic foot exam She saw podiatrist for her long dystrophic toenails with onychomycosis.  He recommended diabetic shoes.  I brought her in for a diabetic foot exam in order to justify diabetic shoes. She states she had diabetic shoes in the past.  I note she has elevated blood pressure.  Her blood pressure history shows elevated blood pressures more often than not over the last 3 months.  She also notes that she has a dry cough.  I am taking her off lisinopril you put her on losartan hydrochlorothiazide.  She is to have her blood pressure checked daily.  She will let me know if it does not improve.  Patient Active Problem List   Diagnosis Date Noted  . Controlled type 2 diabetes mellitus without complication, without long-term current use of insulin ([place]) 09/08/2016  . HLD (hyperlipidemia) 09/08/2016  . Essential hypertension 09/08/2016  . Environmental allergies 09/08/2016  . GERD without esophagitis 09/08/2016  . Chronic back pain 09/08/2016  . Depression with anxiety 09/08/2016  . Insomnia 09/08/2016    Outpatient Encounter Medications as of 05/10/2017  Medication Sig  . amitriptyline (ELAVIL) 25 MG tablet Take 1 tablet (25 mg total) by mouth at bedtime.  Marland Kitchen aspirin EC 81 MG tablet Take 1 tablet (81 mg total) by mouth daily.  . blood glucose meter kit and supplies KIT Dispense based on patient and insurance preference.  . busPIRone (BUSPAR) 5 MG tablet Take 1 tablet (5 mg total) by mouth 3 (three) times daily.  . cetirizine (ZYRTEC) 10 MG tablet Take 1 tablet (10 mg total) by mouth daily.  . cyclobenzaprine (FLEXERIL) 5 MG tablet Take 1 tablet (5 mg total) by mouth at bedtime.  . gabapentin (NEURONTIN) 300 MG capsule Take 1 capsule (300 mg total) by mouth 3 (three) times daily.  Marland Kitchen ibuprofen (ADVIL,MOTRIN) 800 MG tablet Take 1 tablet (800 mg total) by mouth every 8 (eight) hours as needed for  moderate pain.  . metFORMIN (GLUCOPHAGE) 500 MG tablet Take 1 tablet (500 mg total) by mouth 2 (two) times daily with a meal.  . ranitidine (ZANTAC) 150 MG tablet TAKE 1 TABLET BY MOUTH TWICE DAILY.  . simvastatin (ZOCOR) 40 MG tablet Take 1 tablet (40 mg total) by mouth daily.  Marland Kitchen venlafaxine XR (EFFEXOR-XR) 75 MG 24 hr capsule Take 1 capsule (75 mg total) by mouth daily with breakfast.  . losartan-hydrochlorothiazide (HYZAAR) 100-25 MG tablet Take 1 tablet by mouth daily.   No facility-administered encounter medications on file as of 05/10/2017.     No Known Allergies  Review of Systems  Constitutional: Negative for activity change, appetite change and unexpected weight change.  HENT: Positive for dental problem. Negative for congestion, postnasal drip and rhinorrhea.        No dental care, dental fractures  Eyes: Negative for redness and visual disturbance.       Recent eye exam  Respiratory: Negative for cough and shortness of breath.   Cardiovascular: Negative for chest pain, palpitations and leg swelling.  Gastrointestinal: Negative for abdominal pain, constipation and diarrhea.  Genitourinary: Negative for difficulty urinating, frequency and menstrual problem.  Musculoskeletal: Positive for arthralgias, back pain, gait problem and neck pain.       Back pain chronic, generalized body aching  Neurological: Negative for dizziness, weakness, numbness and headaches.       Headache last 2 days.  Points  to forehead.  No associated symptoms  Psychiatric/Behavioral: Negative for dysphoric mood and sleep disturbance. The patient is not nervous/anxious.     Physical Exam  Constitutional: She is oriented to person, place, and time. She appears well-developed and well-nourished.  No apparent discomfort  HENT:  Head: Normocephalic and atraumatic.  Mouth/Throat: Oropharynx is clear and moist.  Eyes: Pupils are equal, round, and reactive to light. Conjunctivae are normal.  Neck: Normal range  of motion.  Cardiovascular: Normal rate, regular rhythm and normal heart sounds.  Pulses:      Dorsalis pedis pulses are 1+ on the right side, and 1+ on the left side.       Posterior tibial pulses are 1+ on the right side, and 1+ on the left side.  Pulmonary/Chest: Effort normal and breath sounds normal.  Musculoskeletal: Normal range of motion.       Right foot: There is normal range of motion and no deformity.       Left foot: There is normal range of motion and no deformity.  Feet:  Right Foot:  Protective Sensation: 6 sites tested. 6 sites sensed.  Skin Integrity: Negative for ulcer, blister or skin breakdown.  Left Foot:  Protective Sensation: 6 sites tested. 6 sites sensed.  Skin Integrity: Negative for ulcer, blister or skin breakdown.  Neurological: She is alert and oriented to person, place, and time. She displays normal reflexes.  Skin:  Toenails on both feet are long, dystrophic, thickened, discolored.  Psychiatric: Her behavior is normal.  Impetuous.  Impaired judgment.  Appears frustrated with pain control.  Questions why she needs physical therapy    BP (!) 158/98   Pulse 95   Temp 98.6 F (37 C) (Oral)   Resp 14   Ht 5' 5"  (1.651 m)   Wt 199 lb 1.3 oz (90.3 kg)   SpO2 93%   BMI 33.13 kg/m    ASSESSMENT/PLAN:  1. Controlled type 2 diabetes mellitus without complication, without long-term current use of insulin ([place]) Numbness/neuropathy.  A1c is 5.9  2. Essential hypertension Not well controlled.  Possible cough from lisinopril.  Will change blood pressure medication and follow.   Patient Instructions  Change BP medicine I will order diabetic shoes  follow up with Dr Lorra Hals   Raylene Everts, MD

## 2017-05-10 NOTE — Patient Instructions (Signed)
Change BP medicine I will order diabetic shoes  follow up with Dr Otilio MiuBarrino

## 2017-05-14 ENCOUNTER — Other Ambulatory Visit: Payer: Self-pay | Admitting: Family Medicine

## 2017-05-15 ENCOUNTER — Encounter (HOSPITAL_COMMUNITY): Payer: Medicare Other | Admitting: Physical Therapy

## 2017-05-17 ENCOUNTER — Ambulatory Visit (HOSPITAL_COMMUNITY): Payer: Medicare Other | Admitting: Physical Therapy

## 2017-05-18 ENCOUNTER — Ambulatory Visit (HOSPITAL_COMMUNITY): Payer: Medicare Other

## 2017-05-18 ENCOUNTER — Telehealth (HOSPITAL_COMMUNITY): Payer: Self-pay

## 2017-05-18 NOTE — Telephone Encounter (Signed)
Patient canceled she is not feeling well and thinks she have a virus

## 2017-05-21 ENCOUNTER — Ambulatory Visit (HOSPITAL_COMMUNITY): Payer: Medicare Other

## 2017-05-21 DIAGNOSIS — M6281 Muscle weakness (generalized): Secondary | ICD-10-CM | POA: Diagnosis not present

## 2017-05-21 DIAGNOSIS — M5416 Radiculopathy, lumbar region: Secondary | ICD-10-CM | POA: Diagnosis not present

## 2017-05-21 NOTE — Therapy (Signed)
[place], [place], [address] Phone: [phone]   Fax:  [phone]  Physical Therapy Treatment  Patient Details  Name: Leah King MRN: [id number] Date of Birth: August 17, 1955 Referring Provider: Arther Abbott, MD   Encounter Date: 05/21/2017  PT End of Session - 05/21/17 0904    Visit Number  10    Number of Visits  18    Date for PT Re-Evaluation  05/30/17    Authorization Type  medicare     Authorization Time Period  NEW: 05/09/17 to 05/30/17    Authorization - Visit Number  2    Authorization - Number of Visits  10    PT Start Time  0905    PT Stop Time  0950 5 minutes at EOS sitting checking blood sugar (was 96, pt denies feeling unwell or dizzy)    PT Time Calculation (min)  45 min    Activity Tolerance  Patient tolerated treatment well    Behavior During Therapy  [facility] for tasks assessed/performed       Past Medical History:  Diagnosis Date  . Anxiety   . Arthritis    back and legs  . Depression   . Diabetes mellitus without complication ([place])   . GERD (gastroesophageal reflux disease)   . Hyperlipidemia   . Hypertension     Past Surgical History:  Procedure Laterality Date  . SKIN GRAFT FULL THICKNESS ARM      There were no vitals filed for this visit.  Subjective Assessment - 05/21/17 0908    Subjective  Patient arrives reporting her Rt leg is bothering her a lot more today and states she is having pain down her whole leg stating "it hurts let's put it that way". She reports her  exercises have been helping to reduce her back pain but that her Rt leg feels weak and sore like it wants to give out on her when she walks for a long time. She reports she has recently changed doctors and is scheduled to have her first visit with her doctor next week.    Limitations  Standing;Walking    Patient Stated Goals  improved activity tolerance, decreased pain.     Currently in Pain?  Yes    Pain Score  8      Pain Location  Leg    Pain Orientation  Right    Pain Descriptors / Indicators  Aching;Burning;Dull    Pain Type  Chronic pain    Pain Onset  More than a month ago    Pain Frequency  Constant       [facility] Adult PT Treatment/Exercise - 05/21/17 0001      Lumbar Exercises: Standing   Other Standing Lumbar Exercises  Lateral step ups on 4" box; 1x 15 reps Bil LE      Lumbar Exercises: Seated   Sit to Stand  10 reps;Limitations    Sit to Stand Limitations  3 sets, no UE support 2nd/3rd set with yellow weighted ball      Lumbar Exercises: Supine   Straight Leg Raise  15 reps;Limitations    Straight Leg Raises Limitations  bil LE      Lumbar Exercises: Sidelying   Hip Abduction  Both;15 reps;Limitations    Hip Abduction Limitations  2 sets      Manual Therapy   Manual Therapy  Joint mobilization;Soft tissue mobilization    Manual therapy comments  done seperate from all other apects  of treatment    Joint Mobilization  distration of Rt hip; 3x 30 seconds    Soft tissue mobilization  IASTM with massage bar to Rt glut med/max with deep pressure to tigger points) patient reported she has some radiating pain into Rt lower leg with trigger points)        PT Education - 05/21/17 0942    Education provided  Yes    Education Details  educated soft tissue interventions and new exercises this session.    Person(s) Educated  Patient    Methods  Explanation    Comprehension  Verbalized understanding       PT Short Term Goals - 05/09/17 0942      PT SHORT TERM GOAL #1   Title  PT to demonstrate good sitting posture to decrease stress on lumbar spine.     Baseline  4/17: pt in appropriate posture while sitting but still has some LBP and BLE pain with sitting    Time  3    Period  Weeks    Status  Partially Met      PT SHORT TERM GOAL #2   Title  Pt to demonstrate good body mechanics for activities such as lifting, pushing vacuum cleaner and reaching across the building.      Baseline  4/17: cues for proper technique throughout all    Time  3    Period  Weeks    Status  On-going      PT SHORT TERM GOAL #3   Title  Pt pain to be no greater than a 4/10 to allow pt to be able to sit in comfort for 30 minutes for traveling and having a meal.     Baseline  4/17: can sit for 30 mins easily without pain    Time  3    Period  Weeks    Status  Achieved        PT Long Term Goals - 05/09/17 0944      PT LONG TERM GOAL #1   Title  PT strength in her core and LE to be increased by one grade to allow pt to be able to complete her housework without having to rest.     Baseline  4/17: see MMT    Time  6    Period  Weeks    Status  Partially Met      PT LONG TERM GOAL #2   Title  Pt to be able to tolerate sitting for over an hour for socialization.     Baseline  4/17: can sit for an hour more times that not    Time  6    Period  Weeks    Status  Partially Met      PT LONG TERM GOAL #3   Title  PT to be able to walk for at least an hour to return to her hobby of walking at least three times a week.     Baseline  4/17: states she can walk for an hour, but sometimes she has pain, sometimes she doesn't    Time  6    Period  Weeks    Status  Partially Met          Plan - 05/21/17 0907    Clinical Impression Statement  Patient arrived with Rt LE pain and Rt hip distraction performed at start of session for pain relief. Continued with current plan for bil hip strengthening. Advanced sit to stands with  patient holding weighted ball to increase challenge and require no UE use during exercises. Soft tissue mobilization to Rt glut med/max performed with massage stick and multiple trigger points noted. Patient reported pressure to trigger points re-create her pain down her right leg. She reported decreased pain following manual therapy and pain was a 6/10 during remaining exercises compared to 8/10 at start of session. She will continue to benefit from skilled PT services  to address current impairments and improve functional mobility.    Rehab Potential  Good    PT Frequency  3x / week    PT Duration  3 weeks    PT Treatment/Interventions  ADLs/Self Care Home Management;Therapeutic activities;Therapeutic exercise;Patient/family education;Manual techniques    PT Next Visit Plan  Continue and progress hip/functional strengthening in standing. Continue with STM to Rt glut muscles.    PT Home Exercise Plan  Eval: bridge, posterior pelvic tilt, ab set; 03/28/17 - clamshell with band; 4/17: sidelying hip abd, STS, st hip abd and hip ext with RTB    Consulted and Agree with Plan of Care  Patient       Patient will benefit from skilled therapeutic intervention in order to improve the following deficits and impairments:  Decreased activity tolerance, Difficulty walking, Decreased strength, Pain, Improper body mechanics, Postural dysfunction, Obesity  Visit Diagnosis: Radiculopathy, lumbar region  Muscle weakness (generalized)     Problem List Patient Active Problem List   Diagnosis Date Noted  . Controlled type 2 diabetes mellitus without complication, without long-term current use of insulin ([place]) 09/08/2016  . HLD (hyperlipidemia) 09/08/2016  . Essential hypertension 09/08/2016  . Environmental allergies 09/08/2016  . GERD without esophagitis 09/08/2016  . Chronic back pain 09/08/2016  . Depression with anxiety 09/08/2016  . Insomnia 09/08/2016    Kipp Brood, PT, DPT Physical Therapist with [facility]  05/21/2017 10:01 AM    [place], [place], [address] Phone: [phone]   Fax:  [phone]  Name: Debie Ashline MRN: [id number] Date of Birth: [birth year]

## 2017-05-22 ENCOUNTER — Encounter (HOSPITAL_COMMUNITY): Payer: Medicare Other | Admitting: Physical Therapy

## 2017-05-23 ENCOUNTER — Ambulatory Visit (HOSPITAL_COMMUNITY): Payer: Medicare Other | Attending: Orthopedic Surgery | Admitting: Physical Therapy

## 2017-05-23 DIAGNOSIS — M5416 Radiculopathy, lumbar region: Secondary | ICD-10-CM | POA: Insufficient documentation

## 2017-05-23 DIAGNOSIS — M6281 Muscle weakness (generalized): Secondary | ICD-10-CM | POA: Diagnosis not present

## 2017-05-23 NOTE — Therapy (Signed)
[place], [place], [address] Phone: [phone]   Fax:  [phone]  Physical Therapy Treatment  Patient Details  Name: Leah King MRN: [id number] Date of Birth: 1955/10/29 Referring Provider: Arther Abbott, MD   Encounter Date: 05/23/2017  PT End of Session - 05/23/17 0936    Visit Number  11    Number of Visits  18    Date for PT Re-Evaluation  05/30/17    Authorization Type  medicare     Authorization Time Period  NEW: 05/09/17 to 05/30/17    Authorization - Visit Number  3    Authorization - Number of Visits  10    PT Start Time  0904    PT Stop Time  0955    PT Time Calculation (min)  51 min    Activity Tolerance  Patient tolerated treatment well    Behavior During Therapy  [facility] for tasks assessed/performed       Past Medical History:  Diagnosis Date  . Anxiety   . Arthritis    back and legs  . Depression   . Diabetes mellitus without complication ([place])   . GERD (gastroesophageal reflux disease)   . Hyperlipidemia   . Hypertension     Past Surgical History:  Procedure Laterality Date  . SKIN GRAFT FULL THICKNESS ARM      There were no vitals filed for this visit.  Subjective Assessment - 05/23/17 0910    Subjective  PT states she can tell she's getting better, "yall are doing a good job".  Currently 7/10 in Rt LE, no pain in back.     Currently in Pain?  Yes    Pain Score  7     Pain Location  Leg    Pain Orientation  Right    Pain Descriptors / Indicators  Aching;Burning    Pain Type  Chronic pain                       [facility] Adult PT Treatment/Exercise - 05/23/17 0001      Lumbar Exercises: Machines for Strengthening   Other Lumbar Machine Exercise  vectors 5X5" each with 1 HHA    Other Lumbar Machine Exercise  SLS max of 3" each, tandem 30" each all without UE's      Lumbar Exercises: Standing   Other Standing Lumbar Exercises  Lateral step ups on 6" box; 1x 15  reps Bil LE    Other Standing Lumbar Exercises  bil hip ext and abd with RTB x15 reps each      Lumbar Exercises: Seated   Sit to Stand  10 reps;Limitations    Sit to Stand Limitations  3 sets, no UE support      Lumbar Exercises: Supine   Straight Leg Raise  15 reps;Limitations    Straight Leg Raises Limitations  bil LE 2 sets      Lumbar Exercises: Sidelying   Hip Abduction  Both;15 reps;Limitations    Hip Abduction Limitations  2 sets      Manual Therapy   Manual Therapy  Joint mobilization;Soft tissue mobilization    Manual therapy comments  done seperate from all other apects of treatment    Joint Mobilization  distration of Rt hip; 3x 30 seconds    Soft tissue mobilization  IASTM with massage bar to Rt glut med/max with deep pressure to tigger points) patient reported she has some radiating pain into  Rt lower leg with trigger points)               PT Short Term Goals - 05/09/17 0942      PT SHORT TERM GOAL #1   Title  PT to demonstrate good sitting posture to decrease stress on lumbar spine.     Baseline  4/17: pt in appropriate posture while sitting but still has some LBP and BLE pain with sitting    Time  3    Period  Weeks    Status  Partially Met      PT SHORT TERM GOAL #2   Title  Pt to demonstrate good body mechanics for activities such as lifting, pushing vacuum cleaner and reaching across the building.     Baseline  4/17: cues for proper technique throughout all    Time  3    Period  Weeks    Status  On-going      PT SHORT TERM GOAL #3   Title  Pt pain to be no greater than a 4/10 to allow pt to be able to sit in comfort for 30 minutes for traveling and having a meal.     Baseline  4/17: can sit for 30 mins easily without pain    Time  3    Period  Weeks    Status  Achieved        PT Long Term Goals - 05/09/17 0944      PT LONG TERM GOAL #1   Title  PT strength in her core and LE to be increased by one grade to allow pt to be able to complete  her housework without having to rest.     Baseline  4/17: see MMT    Time  6    Period  Weeks    Status  Partially Met      PT LONG TERM GOAL #2   Title  Pt to be able to tolerate sitting for over an hour for socialization.     Baseline  4/17: can sit for an hour more times that not    Time  6    Period  Weeks    Status  Partially Met      PT LONG TERM GOAL #3   Title  PT to be able to walk for at least an hour to return to her hobby of walking at least three times a week.     Baseline  4/17: states she can walk for an hour, but sometimes she has pain, sometimes she doesn't    Time  6    Period  Weeks    Status  Partially Met            Plan - 05/23/17 0937    Clinical Impression Statement  continued with focus on improving LE strength and decreaseing Rt LE pain.  Began session with standing strengthening exercises progressing to balance and manual.   Began static balance activites with noted difficutly only able to balance 3" max each LE without UE assist.  Pt able to maintain 30" tandem, however not in true straight line tandem form.  Added vector stance to work on glute stability.  PT with overall good form with therex needing only minimal cues for form.  PT without c/o pain other than some back discomfort during 3rd set of sit to stands.  Ensured pateint was stabilizing corretly to prevent this.  Continued manual with Rt LE distraction and myofascial to Rt  glute region.   Unable to locate source of pain with manual.  Pt reported being painfree at end of session.     Rehab Potential  Good    PT Frequency  3x / week    PT Duration  3 weeks    PT Treatment/Interventions  ADLs/Self Care Home Management;Therapeutic activities;Therapeutic exercise;Patient/family education;Manual techniques    PT Next Visit Plan  Continue and progress hip/functional strengthening in standing. Continue with STM to Rt glut muscles.  Progress balannce and stabiltiy exercises/glute strength.      PT Home  Exercise Plan  Eval: bridge, posterior pelvic tilt, ab set; 03/28/17 - clamshell with band; 4/17: sidelying hip abd, STS, st hip abd and hip ext with RTB    Consulted and Agree with Plan of Care  Patient       Patient will benefit from skilled therapeutic intervention in order to improve the following deficits and impairments:  Decreased activity tolerance, Difficulty walking, Decreased strength, Pain, Improper body mechanics, Postural dysfunction, Obesity  Visit Diagnosis: Radiculopathy, lumbar region  Muscle weakness (generalized)     Problem List Patient Active Problem List   Diagnosis Date Noted  . Controlled type 2 diabetes mellitus without complication, without long-term current use of insulin ([place]) 09/08/2016  . HLD (hyperlipidemia) 09/08/2016  . Essential hypertension 09/08/2016  . Environmental allergies 09/08/2016  . GERD without esophagitis 09/08/2016  . Chronic back pain 09/08/2016  . Depression with anxiety 09/08/2016  . Insomnia 09/08/2016    Roseanne Reno B 05/23/2017, 10:35 AM  [place], [place], [address] Phone: [phone]   Fax:  [phone]  Name: Leah King MRN: [id number] Date of Birth: 08/31/1955

## 2017-05-24 ENCOUNTER — Encounter (HOSPITAL_COMMUNITY): Payer: Medicare Other | Admitting: Physical Therapy

## 2017-05-29 ENCOUNTER — Encounter (HOSPITAL_COMMUNITY): Payer: Medicare Other | Admitting: Physical Therapy

## 2017-05-29 DIAGNOSIS — M5136 Other intervertebral disc degeneration, lumbar region: Secondary | ICD-10-CM | POA: Diagnosis not present

## 2017-05-29 DIAGNOSIS — M5416 Radiculopathy, lumbar region: Secondary | ICD-10-CM | POA: Diagnosis not present

## 2017-05-29 DIAGNOSIS — I10 Essential (primary) hypertension: Secondary | ICD-10-CM | POA: Diagnosis not present

## 2017-05-29 DIAGNOSIS — E119 Type 2 diabetes mellitus without complications: Secondary | ICD-10-CM | POA: Diagnosis not present

## 2017-05-29 DIAGNOSIS — F418 Other specified anxiety disorders: Secondary | ICD-10-CM | POA: Diagnosis not present

## 2017-05-30 ENCOUNTER — Telehealth (HOSPITAL_COMMUNITY): Payer: Self-pay | Admitting: *Deleted

## 2017-05-30 ENCOUNTER — Ambulatory Visit (HOSPITAL_COMMUNITY): Payer: Medicare Other | Admitting: Physical Therapy

## 2017-05-30 NOTE — Telephone Encounter (Signed)
05/30/17  pt called to cx because she said that she had to go to [facility] but would be here Friday

## 2017-05-31 ENCOUNTER — Encounter (HOSPITAL_COMMUNITY): Payer: Medicare Other | Admitting: Physical Therapy

## 2017-06-01 ENCOUNTER — Ambulatory Visit (HOSPITAL_COMMUNITY): Payer: Medicare Other | Admitting: Physical Therapy

## 2017-06-01 ENCOUNTER — Encounter (HOSPITAL_COMMUNITY): Payer: Self-pay | Admitting: Physical Therapy

## 2017-06-01 DIAGNOSIS — M6281 Muscle weakness (generalized): Secondary | ICD-10-CM

## 2017-06-01 DIAGNOSIS — M5416 Radiculopathy, lumbar region: Secondary | ICD-10-CM

## 2017-06-01 NOTE — Therapy (Signed)
[facility] [address] [place], [place], [address] Phone: [phone]   Fax:  [phone]  Physical Therapy Treatment / Re-assessment / Discharge Summary  Patient Details  Name: Leah King MRN: [id number] Date of Birth: 1955-04-19 Referring Provider: Arther Abbott, MD   Encounter Date: 06/01/2017   Progress Note Reporting Period 05/09/17 to 06/01/17  See note below for Objective Data and Assessment of Progress/Goals.       PT End of Session - 06/01/17 1114    Visit Number  12    Number of Visits  18    Date for PT Re-Evaluation  05/30/17    Authorization Type  medicare     Authorization Time Period  NEW: 05/09/17 to 05/30/17    Authorization - Visit Number  4    Authorization - Number of Visits  10    PT Start Time  1026    PT Stop Time  1101    PT Time Calculation (min)  35 min    Activity Tolerance  Patient tolerated treatment well    Behavior During Therapy  WFL for tasks assessed/performed       Past Medical History:  Diagnosis Date  . Anxiety   . Arthritis    back and legs  . Depression   . Diabetes mellitus without complication ([place])   . GERD (gastroesophageal reflux disease)   . Hyperlipidemia   . Hypertension     Past Surgical History:  Procedure Laterality Date  . SKIN GRAFT FULL THICKNESS ARM      There were no vitals filed for this visit.  Subjective Assessment - 06/01/17 1031    Subjective  Patient stated that her right leg is still bothering her, but that her back is not bothering her anymore. Patient stated she feels that she has made good progress with her therapy and that right now it has done what it can do.     How long can you sit comfortably?  no problem     How long can you stand comfortably?  30 minutes     How long can you walk comfortably?  15 minutes     Currently in Pain?  Yes    Pain Score  8     Pain Location  Leg    Pain Orientation  Right    Pain Descriptors / Indicators   Aching;Burning    Pain Type  Chronic pain    Pain Onset  More than a month ago    Aggravating Factors   walking    Pain Relieving Factors  rest    Effect of Pain on Daily Activities  Minimally limits         [facility] PT Assessment - 06/01/17 0001      Assessment   Medical Diagnosis  Lumbar radiculopathy    Referring Provider  Arther Abbott, MD    Onset Date/Surgical Date  02/28/17    Next MD Visit  f/u in 1 year    Prior Therapy  long ago       Prior Function   Level of Independence  Independent    Vocation  Part time employment    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     Leisure  walk      Cognition   Overall Cognitive Status  Within Functional Limits for tasks assessed      Observation/Other Assessments   Focus on Therapeutic Outcomes (FOTO)   60% (40% limited)  Functional Tests   Functional tests  Other;Other2      Other:   Other/ Comments  Lifting 10# from floor to chest without pain and good form on second attempt      Other:   Other/Comments  vacuuming, patient demonstrated proper form      Strength   Right Hip Flexion  5/5 was 3 then 4+/5    Right Hip Extension  4+/5 was 3+/5 then 4-/5    Right Hip ABduction  4+/5 was 3/5 then 4-/5    Left Hip Flexion  5/5 was 3/5 then 4+/5    Left Hip Extension  4+/5 was 3+/5 then 4/5    Left Hip ABduction  4/5 was 3/5 then 4-/5    Right Knee Flexion  5/5    Right Knee Extension  5/5    Left Knee Flexion  5/5    Left Knee Extension  5/5    Right Ankle Dorsiflexion  5/5    Left Ankle Dorsiflexion  5/5      Flexibility   Hamstrings  Minimally limited                           PT Education - 06/01/17 1113    Education provided  Yes    Education Details  Patient was educated on re-assessment findings and continuation of HEP, Building services engineer, and senior center.     Person(s) Educated  Patient    Methods  Explanation;Handout    Comprehension  Verbalized understanding        PT Short Term Goals - 06/01/17 1040      PT SHORT TERM GOAL #1   Title  PT to demonstrate good sitting posture to decrease stress on lumbar spine.     Baseline  06/01/17: pt in appropriate posture while sitting and does not have pain with this    Time  3    Period  Weeks    Status  Achieved      PT SHORT TERM GOAL #2   Title  Pt to demonstrate good body mechanics for activities such as lifting, pushing vacuum cleaner and reaching across the building.     Baseline  06/01/17: Patient demonstrated good body mechanics on the second attempt.     Time  3    Period  Weeks    Status  Achieved      PT SHORT TERM GOAL #3   Title  Pt pain to be no greater than a 4/10 to allow pt to be able to sit in comfort for 30 minutes for traveling and having a meal.     Baseline  06/01/17: can sit for 30 mins easily without pain    Time  3    Period  Weeks    Status  Achieved        PT Long Term Goals - 06/01/17 1049      PT LONG TERM GOAL #1   Title  PT strength in her core and LE to be increased by one grade to allow pt to be able to complete her housework without having to rest.     Baseline  06/01/17: see MMT    Time  6    Period  Weeks    Status  Achieved      PT LONG TERM GOAL #2   Title  Pt to be able to tolerate sitting for over an hour for socialization.     Baseline  06/01/17: Patient reported that she can sit for an hour without an issue     Time  6    Period  Weeks    Status  Achieved      PT LONG TERM GOAL #3   Title  PT to be able to walk for at least an hour to return to her hobby of walking at least three times a week.     Baseline  06/01/17: Patient stated she can walk without pain at times, but sometimes has right leg pain.     Time  6    Period  Weeks    Status  Partially Met            Plan - 06/01/17 1226    Clinical Impression Statement  This session performed a re-assessment of patient's progress with goals. Patient achieved 3 out of 3 of her short term goals  and achieved 2 out of 3 of her long term goals. Patient continued to report that she is unable to walk for an hour without an increase in her pain, however she stated it is not only in her right leg. Patient has demonstrated good improvement with physical therapy and patient reported that she is happy with her current functional level. She stated she is going to follow-up with her physician regarding her continued right leg pain. The remainder of the session, therapist discussed how patient should continue her home exercises and provided patient on information to join a gym and the senior center as well as provided the patient with how to contact the clinic if she had any future questions or concerns.     Rehab Potential  Good    PT Frequency  3x / week    PT Duration  3 weeks    PT Treatment/Interventions  ADLs/Self Care Home Management;Therapeutic activities;Therapeutic exercise;Patient/family education;Manual techniques    PT Next Visit Plan  Continue and progress hip/functional strengthening in standing. Continue with STM to Rt glut muscles.  Progress balannce and stabiltiy exercises/glute strength.      PT Home Exercise Plan  Eval: bridge, posterior pelvic tilt, ab set; 03/28/17 - clamshell with band; 4/17: sidelying hip abd, STS, st hip abd and hip ext with RTB; 06/01/17: Tandem balance, mini-squats, sidestepping with band, SLR    Consulted and Agree with Plan of Care  Patient       Patient will benefit from skilled therapeutic intervention in order to improve the following deficits and impairments:  Decreased activity tolerance, Difficulty walking, Decreased strength, Pain, Improper body mechanics, Postural dysfunction, Obesity  Visit Diagnosis: Radiculopathy, lumbar region  Muscle weakness (generalized)     Problem List Patient Active Problem List   Diagnosis Date Noted  . Controlled type 2 diabetes mellitus without complication, without long-term current use of insulin ([place]) 09/08/2016   . HLD (hyperlipidemia) 09/08/2016  . Essential hypertension 09/08/2016  . Environmental allergies 09/08/2016  . GERD without esophagitis 09/08/2016  . Chronic back pain 09/08/2016  . Depression with anxiety 09/08/2016  . Insomnia 09/08/2016   PHYSICAL THERAPY DISCHARGE SUMMARY  Visits from Start of Care: 12  Current functional level related to goals / functional outcomes: See above   Remaining deficits: See above   Education / Equipment: See above Plan: Patient agrees to discharge.  Patient goals were partially met. Patient is being discharged due to being pleased with the current functional level.  ?????         Clarene Critchley PT, DPT 12:30 PM, 06/01/17 [phone]  [place], [place], [address] Phone: [phone]   Fax:  [phone]  Name: Leah King MRN: [id number] Date of Birth: 1955-08-12

## 2017-06-20 ENCOUNTER — Other Ambulatory Visit: Payer: Self-pay | Admitting: Family Medicine

## 2017-06-21 ENCOUNTER — Other Ambulatory Visit: Payer: Self-pay | Admitting: Family Medicine

## 2017-06-25 DIAGNOSIS — E1151 Type 2 diabetes mellitus with diabetic peripheral angiopathy without gangrene: Secondary | ICD-10-CM | POA: Diagnosis not present

## 2017-06-25 DIAGNOSIS — M2041 Other hammer toe(s) (acquired), right foot: Secondary | ICD-10-CM | POA: Diagnosis not present

## 2017-06-25 DIAGNOSIS — B351 Tinea unguium: Secondary | ICD-10-CM | POA: Diagnosis not present

## 2017-06-25 DIAGNOSIS — L851 Acquired keratosis [keratoderma] palmaris et plantaris: Secondary | ICD-10-CM | POA: Diagnosis not present

## 2017-07-30 DIAGNOSIS — E1151 Type 2 diabetes mellitus with diabetic peripheral angiopathy without gangrene: Secondary | ICD-10-CM | POA: Diagnosis not present

## 2017-08-16 ENCOUNTER — Encounter: Payer: Self-pay | Admitting: Orthopedic Surgery

## 2017-08-16 ENCOUNTER — Ambulatory Visit (INDEPENDENT_AMBULATORY_CARE_PROVIDER_SITE_OTHER): Payer: Medicare Other | Admitting: Orthopedic Surgery

## 2017-08-16 ENCOUNTER — Ambulatory Visit (INDEPENDENT_AMBULATORY_CARE_PROVIDER_SITE_OTHER): Payer: Medicare Other

## 2017-08-16 VITALS — BP 132/68 | HR 78 | Ht 65.0 in | Wt 197.0 lb

## 2017-08-16 DIAGNOSIS — M545 Low back pain: Secondary | ICD-10-CM | POA: Diagnosis not present

## 2017-08-16 DIAGNOSIS — G8929 Other chronic pain: Secondary | ICD-10-CM | POA: Diagnosis not present

## 2017-08-16 DIAGNOSIS — M5441 Lumbago with sciatica, right side: Secondary | ICD-10-CM | POA: Diagnosis not present

## 2017-08-16 NOTE — Progress Notes (Signed)
Progress Note   Patient ID: Leah King, female   DOB: 1955/10/24, 62 y.o.   MRN: [id number]   Chief Complaint  Patient presents with  . Follow-up    R side lower back pain going down to ankle for years    The patient presents for evaluation of lower back pain  She is 62 years old she has hypertension diabetes and half pack per day smoker.  Location lower back both legs from the hips to just below the knee right worse than left Duration 3-1/2 years  Quality  dull ache Severity "it hurts really bad", severe Associated with giving way of the right leg, pain with walking and standing.  No complaints of numbness or tingling no history of cancer no bowel or bladder dysfunction no increase pain at night for weight loss  Prior treatment includes physical therapy 6-week course Naprosyn, Flexeril, gabapentin, Elavil.   Review of Systems  Constitutional: Negative for chills, fever, malaise/fatigue and weight loss.  Gastrointestinal: Negative for constipation.       Denies loss bowel control   Genitourinary:       Denies urinary retention or los of bladder control   Neurological: Negative for tingling, tremors, sensory change, focal weakness and weakness.   No outpatient medications have been marked as taking for the 08/16/17 encounter (Office Visit) with Vickki Hearing, MD.    Past Medical History:  Diagnosis Date  . Anxiety   . Arthritis    back and legs  . Depression   . Diabetes mellitus without complication ([facility])   . GERD (gastroesophageal reflux disease)   . Hyperlipidemia   . Hypertension      No Known Allergies   BP 132/68   Pulse 78   Ht 5\' 5"  (1.651 m)   Wt 197 lb (89.4 kg)   BMI 32.78 kg/m  BP 132/68   Pulse 78   Ht 5\' 5"  (1.651 m)   Wt 197 lb (89.4 kg)   BMI 32.78 kg/m    Physical Exam  Constitutional: She is oriented to person, place, and time. She appears well-developed and well-nourished.  Cardiovascular: Intact distal pulses.  Pulses:   Dorsalis pedis pulses are 2+ on the right side, and 2+ on the left side.       Posterior tibial pulses are 2+ on the right side, and 2+ on the left side.  Neurological: She is alert and oriented to person, place, and time.  Skin: Capillary refill takes less than 2 seconds.  Psychiatric: She has a normal mood and affect. Judgment normal.  Vitals reviewed.   Ortho Exam   Lumbar  Spine exam  Nontender thoracic spine skin is normal muscle tone is excellent normal alignment  Lumbar spine tenderness midline and right and left L4-S1  Increased lordosis Normal muscle tone skin normal  Semmes Weinstein monofilament test normal in both feet normal pinwheel L4 S1 and L5 bilaterally  Straight leg raise positive on the right negative on the left  Right and left lower extremity: Right and left hip normal range of motion without pain No instability Normal hip flexion strength normal extension power flexion as well as dorsiflexion and plantarflexion Skin normal in both lower extremities  Diabetic foot exam shows multiple areas of nail deformity consistent with diabetic no evidence of lacerations erythema       MEDICAL DECISION MAKING   Imaging:  [facility] x-ray report has been dictated please see details  X-ray shows lumbar disc disease spondylolisthesis L3 on  4   Encounter Diagnosis  Name Primary?  . Chronic low back pain, unspecified back pain laterality, with sciatica presence unspecified Yes     PLAN: (RX., injection, surgery,frx,mri/ct, XR 2 body ares) Standard cane   The patient has significant back pain and leg pain with giving way positive straight leg raise pain is present for 2-1/2 3 years no improvement after therapy multiple medications so we are going to image her with an MRI x-ray does show spondylolisthesis and facet arthrosis  No orders of the defined types were placed in this encounter.  1:47 PM 08/16/2017

## 2017-08-16 NOTE — Patient Instructions (Addendum)
Go for the MRI scan at [facility] on Friday August 2nd 1pm arrive at 12:30

## 2017-08-23 ENCOUNTER — Ambulatory Visit: Payer: Medicare Other | Admitting: Orthopaedic Surgery

## 2017-08-24 ENCOUNTER — Ambulatory Visit (HOSPITAL_COMMUNITY): Admission: RE | Admit: 2017-08-24 | Payer: Medicare Other | Source: Ambulatory Visit

## 2017-09-03 ENCOUNTER — Other Ambulatory Visit: Payer: Self-pay | Admitting: Family Medicine

## 2017-09-06 ENCOUNTER — Other Ambulatory Visit: Payer: Self-pay | Admitting: Family Medicine

## 2017-09-06 ENCOUNTER — Ambulatory Visit: Payer: Medicare Other | Admitting: Orthopedic Surgery

## 2017-09-14 ENCOUNTER — Ambulatory Visit: Payer: Medicare Other | Admitting: Orthopedic Surgery

## 2017-11-07 ENCOUNTER — Telehealth: Payer: Self-pay | Admitting: Orthopedic Surgery

## 2017-11-07 NOTE — Telephone Encounter (Signed)
She can call to schedule MRI [phone] then we can see her in follow up to review MRI  The patient has significant back pain and leg pain with giving way positive straight leg raise pain is present for 2-1/2 3 years no improvement after therapy multiple medications so we are going to image her with an MRI x-ray does show spondylolisthesis and facet arthrosis  Next step is MRI

## 2017-11-07 NOTE — Telephone Encounter (Signed)
Patient's designated Set designer, social worker Desma Paganini, [phone], asking about scheduling another appointment with Dr Romeo Apple for bilateral leg pain, radiating. Last notes indicate patient was evaluated and MRI was ordered for lumbar spine, although patient did not have it done. Please review and please advise.

## 2017-11-07 NOTE — Telephone Encounter (Signed)
Relayed. Patient and her social worker will call Centralized scheduling to have MRI scheduled and will call back with that information for follow up visit to be scheduled with Dr Romeo Apple.

## 2017-11-26 DIAGNOSIS — L851 Acquired keratosis [keratoderma] palmaris et plantaris: Secondary | ICD-10-CM | POA: Diagnosis not present

## 2017-11-26 DIAGNOSIS — M2041 Other hammer toe(s) (acquired), right foot: Secondary | ICD-10-CM | POA: Diagnosis not present

## 2017-11-26 DIAGNOSIS — E1151 Type 2 diabetes mellitus with diabetic peripheral angiopathy without gangrene: Secondary | ICD-10-CM | POA: Diagnosis not present

## 2017-11-26 DIAGNOSIS — B351 Tinea unguium: Secondary | ICD-10-CM | POA: Diagnosis not present

## 2018-02-18 DIAGNOSIS — E1151 Type 2 diabetes mellitus with diabetic peripheral angiopathy without gangrene: Secondary | ICD-10-CM | POA: Diagnosis not present

## 2018-02-18 DIAGNOSIS — L851 Acquired keratosis [keratoderma] palmaris et plantaris: Secondary | ICD-10-CM | POA: Diagnosis not present

## 2018-02-18 DIAGNOSIS — M2041 Other hammer toe(s) (acquired), right foot: Secondary | ICD-10-CM | POA: Diagnosis not present

## 2018-02-18 DIAGNOSIS — B351 Tinea unguium: Secondary | ICD-10-CM | POA: Diagnosis not present

## 2018-02-19 DIAGNOSIS — E119 Type 2 diabetes mellitus without complications: Secondary | ICD-10-CM | POA: Diagnosis not present

## 2018-02-19 DIAGNOSIS — M5416 Radiculopathy, lumbar region: Secondary | ICD-10-CM | POA: Diagnosis not present

## 2018-02-19 DIAGNOSIS — Z23 Encounter for immunization: Secondary | ICD-10-CM | POA: Diagnosis not present

## 2018-03-04 ENCOUNTER — Telehealth: Payer: Self-pay | Admitting: Orthopedic Surgery

## 2018-03-04 DIAGNOSIS — M4807 Spinal stenosis, lumbosacral region: Secondary | ICD-10-CM

## 2018-03-04 NOTE — Telephone Encounter (Signed)
Spoke with patient's social worker Desma Paganini [phone] (designated party form on file), and also case Theme park manager at [employer] ph# [phone] - aware patient missed last appointment and had missed MRI. They are requesting to re-schedule the appointment, or does the MRI need to be re-approved before we can schedule patient's appointment with Dr Romeo Apple. Please advise.

## 2018-03-05 NOTE — Telephone Encounter (Signed)
You ordered the MRI in July 2019 do you want her to come back in first, or have MRI scan first?

## 2018-03-05 NOTE — Telephone Encounter (Signed)
Put in order for the MRI called to schedule Feb 18th 2 pm arrive at 130 I called Vikki Ports to advise. Made follow up appt to review.

## 2018-03-05 NOTE — Telephone Encounter (Signed)
[facility] [place]   MRI FIRST IF THEY WILL DO IT   THEY , INSUR MAY WANT HER SEEN FIRST

## 2018-03-05 NOTE — Telephone Encounter (Signed)
???   Mri re approved????

## 2018-03-06 ENCOUNTER — Encounter (HOSPITAL_COMMUNITY): Payer: Self-pay

## 2018-03-06 ENCOUNTER — Other Ambulatory Visit: Payer: Self-pay

## 2018-03-06 ENCOUNTER — Emergency Department (HOSPITAL_COMMUNITY)
Admission: EM | Admit: 2018-03-06 | Discharge: 2018-03-06 | Disposition: A | Discharge status: Home / Self Care | Payer: Medicare Other | Attending: Emergency Medicine | Admitting: Emergency Medicine

## 2018-03-06 DIAGNOSIS — Z7982 Long term (current) use of aspirin: Secondary | ICD-10-CM | POA: Insufficient documentation

## 2018-03-06 DIAGNOSIS — G8929 Other chronic pain: Secondary | ICD-10-CM | POA: Insufficient documentation

## 2018-03-06 DIAGNOSIS — I10 Essential (primary) hypertension: Secondary | ICD-10-CM | POA: Diagnosis not present

## 2018-03-06 DIAGNOSIS — Z7984 Long term (current) use of oral hypoglycemic drugs: Secondary | ICD-10-CM | POA: Diagnosis not present

## 2018-03-06 DIAGNOSIS — Z79899 Other long term (current) drug therapy: Secondary | ICD-10-CM | POA: Diagnosis not present

## 2018-03-06 DIAGNOSIS — F17210 Nicotine dependence, cigarettes, uncomplicated: Secondary | ICD-10-CM | POA: Diagnosis not present

## 2018-03-06 DIAGNOSIS — M5441 Lumbago with sciatica, right side: Secondary | ICD-10-CM | POA: Diagnosis not present

## 2018-03-06 DIAGNOSIS — E119 Type 2 diabetes mellitus without complications: Secondary | ICD-10-CM | POA: Diagnosis not present

## 2018-03-06 DIAGNOSIS — M545 Low back pain: Secondary | ICD-10-CM | POA: Diagnosis present

## 2018-03-06 MED ORDER — HYDROCODONE-ACETAMINOPHEN 5-325 MG PO TABS: ORAL_TABLET | ORAL | 0 refills | Status: DC

## 2018-03-06 MED ORDER — KETOROLAC TROMETHAMINE 60 MG/2ML IM SOLN
30.0000 mg | Freq: Once | INTRAMUSCULAR | Status: AC
  Administered 2018-03-06: 30 mg via INTRAMUSCULAR
  Filled 2018-03-06: qty 2

## 2018-03-06 NOTE — Discharge Instructions (Addendum)
Apply cool compresses or ice packs on and off to your lower back.  Be sure to keep your appointment for your MRI for next week.  Schedule a follow-up with Dr. Mort SawyersHarrison's office after your MRI.

## 2018-03-06 NOTE — ED Triage Notes (Signed)
Pt reports has history of arthritis in back and legs.  Reports sees Dr. Romeo Apple and is scheduled for MRI next week.  Reports pain in lower back and both legs.  Denies new injury.

## 2018-03-06 NOTE — ED Provider Notes (Signed)
[facility] EMERGENCY DEPARTMENT Provider Note   CSN: [id number] Arrival date & time: 03/06/18  2111     History   Chief Complaint Chief Complaint  Patient presents with  . Back Pain    HPI Leah King is a 63 y.o. female.  HPI   Leah King is a 63 y.o. female with past medical history of chronic back pain, diabetes, and hypertension who presents to the Emergency Department complaining of increasing pain of her low back, right hip and right leg.  She describes a sharp burning pain from her lower back that radiates down into her right hip and into her right leg.  Pain is worse with certain movements and with lying down.  She has been taking gabapentin without relief.  She is followed by orthopedics and has an MRI of her lower back scheduled for next week.  She is here today for pain control.  Pain has been persistent for several weeks to months.  She denies any recent injury, fever, chills, abdominal pain, numbness or weakness of her lower extremities and urine or bowel changes.   Past Medical History:  Diagnosis Date  . Anxiety   . Arthritis    back and legs  . Depression   . Diabetes mellitus without complication ([place])   . GERD (gastroesophageal reflux disease)   . Hyperlipidemia   . Hypertension     Patient Active Problem List   Diagnosis Date Noted  . Controlled type 2 diabetes mellitus without complication, without long-term current use of insulin ([place]) 09/08/2016  . HLD (hyperlipidemia) 09/08/2016  . Essential hypertension 09/08/2016  . Environmental allergies 09/08/2016  . GERD without esophagitis 09/08/2016  . Chronic back pain 09/08/2016  . Depression with anxiety 09/08/2016  . Insomnia 09/08/2016    Past Surgical History:  Procedure Laterality Date  . SKIN GRAFT FULL THICKNESS ARM       OB History   No obstetric history on file.      Home Medications    Prior to Admission medications   Medication Sig Start Date End Date Taking? Authorizing  Provider  amitriptyline (ELAVIL) 25 MG tablet Take 1 tablet (25 mg total) by mouth at bedtime. 10/10/16   Raylene Everts, MD  aspirin EC 81 MG tablet Take 1 tablet (81 mg total) by mouth daily. 10/10/16   Raylene Everts, MD  blood glucose meter kit and supplies KIT Dispense based on patient and insurance preference. 01/04/17   Raylene Everts, MD  busPIRone (BUSPAR) 5 MG tablet Take 1 tablet (5 mg total) by mouth 3 (three) times daily. 10/10/16   Raylene Everts, MD  cetirizine (ZYRTEC) 10 MG tablet Take 1 tablet (10 mg total) by mouth daily. 10/10/16   Raylene Everts, MD  cyclobenzaprine (FLEXERIL) 5 MG tablet Take 1 tablet (5 mg total) by mouth at bedtime. 04/04/17   Raylene Everts, MD  cyclobenzaprine (FLEXERIL) 5 MG tablet TAKE 1 TABLET BY MOUTH AT BEDTIME. 05/15/17   Raylene Everts, MD  gabapentin (NEURONTIN) 300 MG capsule Take 1 capsule (300 mg total) by mouth 3 (three) times daily. 10/10/16   Raylene Everts, MD  ibuprofen (ADVIL,MOTRIN) 800 MG tablet Take 1 tablet (800 mg total) by mouth every 8 (eight) hours as needed for moderate pain. Patient not taking: Reported on 08/16/2017 04/04/17   Raylene Everts, MD  losartan-hydrochlorothiazide [employer] [place]) 100-25 MG tablet Take 1 tablet by mouth daily. 05/10/17   Raylene Everts, MD  metFORMIN (GLUCOPHAGE)  500 MG tablet Take 1 tablet (500 mg total) by mouth 2 (two) times daily with a meal. 10/10/16   Raylene Everts, MD  ranitidine (ZANTAC) 150 MG tablet TAKE 1 TABLET BY MOUTH TWICE DAILY. 03/08/17   Raylene Everts, MD  simvastatin (ZOCOR) 40 MG tablet Take 1 tablet (40 mg total) by mouth daily. 10/10/16   Raylene Everts, MD  venlafaxine XR (EFFEXOR-XR) 75 MG 24 hr capsule Take 1 capsule (75 mg total) by mouth daily with breakfast. 10/10/16   Raylene Everts, MD    Family History Family History  Problem Relation Age of Onset  . Alcohol abuse Mother   . Heart disease Father   . Hypertension Son   .  Hypertension Daughter   . Diabetes Daughter   . Alzheimer's disease Maternal Grandmother   . Diabetes Maternal Grandfather     Social History Social History   Tobacco Use  . Smoking status: Current Every Day Smoker    Packs/day: 0.25    Types: Cigarettes    Start date: 01/23/1990  . Smokeless tobacco: Never Used  . Tobacco comment: 2 cig a day  Substance Use Topics  . Alcohol use: No  . Drug use: No     Allergies   Patient has no known allergies.   Review of Systems Review of Systems  Constitutional: Negative for fever.  Respiratory: Negative for shortness of breath.   Gastrointestinal: Negative for abdominal pain, nausea and vomiting.  Genitourinary: Negative for decreased urine volume, difficulty urinating, dysuria and flank pain.  Musculoskeletal: Positive for back pain. Negative for joint swelling and neck pain.  Skin: Negative for rash.  Neurological: Negative for weakness and numbness.     Physical Exam Updated Vital Signs BP 118/77 (BP Location: Right Arm)   Pulse 93   Temp 98.3 F (36.8 C) (Oral)   Resp 20   Ht 5' 5"  (1.651 m)   Wt 90.3 kg   SpO2 99%   BMI 33.12 kg/m   Physical Exam Vitals signs and nursing note reviewed.  Constitutional:      General: She is not in acute distress.    Appearance: Normal appearance. She is well-developed.  HENT:     Head: Normocephalic and atraumatic.  Neck:     Musculoskeletal: Normal range of motion and neck supple.  Cardiovascular:     Rate and Rhythm: Normal rate and regular rhythm.     Pulses: Normal pulses.     Comments: DP pulses are strong and palpable bilaterally Pulmonary:     Effort: Pulmonary effort is normal. No respiratory distress.     Breath sounds: Normal breath sounds.  Abdominal:     General: There is no distension.     Palpations: Abdomen is soft.     Tenderness: There is no abdominal tenderness.  Musculoskeletal:        General: Tenderness present. No swelling, deformity or signs of  injury.     Lumbar back: She exhibits tenderness and pain. She exhibits normal range of motion, no swelling, no deformity, no laceration and normal pulse.     Comments: ttp of the lower lumbar spine and right paraspinal muscles.  Pain reproduced with SLR on right.  Pt has 5/5 strength against resistance of bilateral lower extremities. Hip flexors and extensors intact    Skin:    General: Skin is warm and dry.     Capillary Refill: Capillary refill takes less than 2 seconds.     Findings: No  rash.  Neurological:     General: No focal deficit present.     Mental Status: She is alert.     Sensory: No sensory deficit.     Motor: No abnormal muscle tone.     Coordination: Coordination normal.     Gait: Gait normal.     Deep Tendon Reflexes:     Reflex Scores:      Patellar reflexes are 2+ on the right side and 2+ on the left side.      Achilles reflexes are 2+ on the right side and 2+ on the left side.     ED Treatments / Results  Labs (all labs ordered are listed, but only abnormal results are displayed) Labs Reviewed - No data to display  EKG None  Radiology No results found.  Procedures Procedures (including critical care time)  Medications Ordered in ED Medications  ketorolac (TORADOL) injection 30 mg (30 mg Intramuscular Given 03/06/18 1042)     Initial Impression / Assessment and Plan / ED Course  I have reviewed the triage vital signs and the nursing notes.  Pertinent labs & imaging results that were available during my care of the patient were reviewed by me and considered in my medical decision making (see chart for details).     Medical record reviewed, patient is scheduled for MRI on March 12, 2018.  Last seen July 2019 by Dr. Aline Brochure for low back pain.  She is currently taking gabapentin.  Database reviewed, will provide short course of pain medication with understanding that she will f/u with Dr. Aline Brochure after her MRI.  No concerning sx's for infectious  process or cauda equina.    Final Clinical Impressions(s) / ED Diagnoses   Final diagnoses:  Chronic right-sided low back pain with right-sided sciatica    ED Discharge Orders    None       Kem Parkinson, PA-C 03/06/18 1106    Fredia Sorrow, MD 03/12/18 [id number]

## 2018-03-12 ENCOUNTER — Ambulatory Visit (HOSPITAL_COMMUNITY)
Admission: RE | Admit: 2018-03-12 | Discharge: 2018-03-12 | Disposition: A | Discharge status: Home / Self Care | Payer: Medicare Other | Source: Ambulatory Visit | Attending: Orthopedic Surgery | Admitting: Orthopedic Surgery

## 2018-03-12 DIAGNOSIS — M4807 Spinal stenosis, lumbosacral region: Secondary | ICD-10-CM

## 2018-03-12 DIAGNOSIS — M5127 Other intervertebral disc displacement, lumbosacral region: Secondary | ICD-10-CM | POA: Diagnosis not present

## 2018-03-15 ENCOUNTER — Ambulatory Visit (INDEPENDENT_AMBULATORY_CARE_PROVIDER_SITE_OTHER): Payer: Medicare Other | Admitting: Orthopedic Surgery

## 2018-03-15 ENCOUNTER — Encounter: Payer: Self-pay | Admitting: Orthopedic Surgery

## 2018-03-15 VITALS — BP 113/77 | HR 119 | Ht 65.0 in | Wt 196.0 lb

## 2018-03-15 DIAGNOSIS — M4807 Spinal stenosis, lumbosacral region: Secondary | ICD-10-CM

## 2018-03-15 MED ORDER — GABAPENTIN 400 MG PO CAPS: 400.0000 mg | ORAL_CAPSULE | Freq: Four times a day (QID) | ORAL | 5 refills | Status: DC

## 2018-03-15 NOTE — Progress Notes (Signed)
FOLLOW UP VISIT : MRI RESULTS   Chief Complaint  Patient presents with  . Back Pain  . Leg Problem    legs burn at night   . Results    review lumbar MRI      HPI: The patient is here TO DISCUSS THE RESULTS OF MRI  Leah King returns back with persistent complaints of burning pain in her legs which is worse with extension especially lying down  She is already on gabapentin 300 mg 3 times daily as well as amitriptyline  She has had ibuprofen as well  She is a smoker she is diabetic  The patient presents for evaluation of lower back pain  She is 63 years old she has hypertension diabetes and half pack per day smoker.  Location lower back both legs from the hips to just below the knee right worse than left Duration 3-1/2 years  Quality  dull ache Severity "it hurts really bad", severe Associated with giving way of the right leg, pain with walking and standing.  No complaints of numbness or tingling no history of cancer no bowel or bladder dysfunction no increase pain at night for weight loss  Prior treatment includes physical therapy 6-week course Naprosyn, Flexeril, gabapentin, Elavil.   Review of Systems  Neurological: Positive for weakness.       Legs intermittently give way     BP 113/77   Pulse (!) 119   Ht 5\' 5"  (1.651 m)   Wt 196 lb (88.9 kg)   BMI 32.62 kg/m     Medical decision-making section   DATA  MRI REPORT:  CLINICAL DATA:  Lumbosacral spinal stenosis   EXAM: MRI LUMBAR SPINE WITHOUT CONTRAST   TECHNIQUE: Multiplanar, multisequence MR imaging of the lumbar spine was performed. No intravenous contrast was administered.   COMPARISON:  08/16/2017   FINDINGS: Segmentation: Partially segmented L5 vertebra based on the lowest ribs   Alignment: Normal. There is L3-4 and L4-5 listhesis when standing on radiography 08/16/2017.   Vertebrae: No fracture, evidence of discitis, or aggressive bone lesion. Right eccentric L5 body hemangioma  extending into the pedicle.   Conus medullaris and cauda equina: Conus extends to the L1 level. Conus and cauda equina appear normal.   Paraspinal and other soft tissues: Negative   Disc levels:   T12- L1: Unremarkable.   L1-L2: Unremarkable.   L2-L3: Disc bulging and facet hypertrophy. Best seen on axial T1 weighted imaging there is a right foraminal protrusion which could affect the L2 nerve root.   L3-L4: Disc narrowing and bulging. Likely superimposed right foraminal protrusion. Posterior element hypertrophy with joint effusion. Moderate spinal stenosis. Moderate right foraminal narrowing.   L4-L5: Degenerative facet spurring. Mild annulus bulging. No impingement   L5-S1:Partial segmentation.  No degenerative changes or impingement   IMPRESSION: 1. Disc bulging and degenerative facet hypertrophy at L2-3 to L4-5. 2. L3-4 moderate spinal and right foraminal stenosis. This stenosis likely worsens with loading as there is dynamic L3-4 anterolisthesis on preceding radiography. 3. L2-3 right foraminal protrusion which could affect the L2 nerve root. 4. Incomplete segmentation of L5. 5. Motion degraded.     Electronically Signed   By: Marnee Spring M.D.   On: 03/13/2018 04:10    MY READING: MRI OF THE the multilevel stenosis and disc protrusion and anterolisthesis is noted on MRI scan.  I agree with the report  Encounter Diagnosis  Name Primary?  . Spinal stenosis of lumbosacral region Yes    PLAN:  A model was used to explain the disease process to the patient  We are sending her for referral for definitive treatment

## 2018-03-15 NOTE — Patient Instructions (Addendum)
Spinal Stenosis  Spinal stenosis happens when the open space (spinal canal) between the bones of your spine (vertebrae) gets smaller. It is caused by bone pushing into the open spaces of your backbone (spine). This puts pressure on your backbone and the nerves in your backbone. Treatment often focuses on managing any pain and symptoms. In some cases, surgery may be needed. Follow these instructions at home: Managing pain, stiffness, and swelling   Do all exercises and stretches as told by your doctor.  Stand and sit up straight (use good posture). If you were given a brace or a corset, wear it as told by your doctor.  Do not do any activities that cause pain. Ask your doctor what activities are safe for you.  Do not lift anything that is heavier than 10 lb (4.5 kg) or heavier than your doctor tells you.  Try to stay at a healthy weight. Talk with your doctor if you need help losing weight.  If directed, put heat on the affected area as often as told by your doctor. Use the heat source that your doctor recommends, such as a moist heat pack or a heating pad. ? Put a towel between your skin and the heat source. ? Leave the heat on for 20-30 minutes. ? Remove the heat if your skin turns bright red. This is especially important if you are not able to feel pain, heat, or cold. You may have a greater risk of getting burned. General instructions  Take over-the-counter and prescription medicines only as told by your doctor.  Do not use any products that contain nicotine or tobacco, such as cigarettes and e-cigarettes. If you need help quitting, ask your doctor.  Eat a healthy diet. This includes plenty of fruits and vegetables, whole grains, and low-fat (lean) protein.  Keep all follow-up visits as told by your doctor. This is important. Contact a doctor if:  Your symptoms do not get better.  Your symptoms get worse.  You have a fever. Get help right away if:  You have new or worse pain  in your neck or upper back.  You have very bad pain that medicine does not control.  You are dizzy.  You have vision problems, blurred vision, or double vision.  You have a very bad headache that is worse when you stand.  You feel sick to your stomach (nauseous).  You throw up (vomit).  You have new or worse numbness or tingling in your back or legs.  You have pain, redness, swelling, or warmth in your arm or leg. Summary  Spinal stenosis happens when the open space (spinal canal) between the bones of your spine gets smaller (narrow).  Contact a doctor if your symptoms get worse.  In some cases, surgery may be needed. This information is not intended to replace advice given to you by your health care provider. Make sure you discuss any questions you have with your health care provider. Document Released: 05/05/2010 Document Revised: 12/15/2015 Document Reviewed: 12/15/2015 Elsevier Interactive Patient Education  2019 [employer] CLINICAL DATA:  Lumbosacral spinal stenosis   EXAM: MRI LUMBAR SPINE WITHOUT CONTRAST   TECHNIQUE: Multiplanar, multisequence MR imaging of the lumbar spine was performed. No intravenous contrast was administered.   COMPARISON:  08/16/2017   FINDINGS: Segmentation: Partially segmented L5 vertebra based on the lowest ribs   Alignment: Normal. There is L3-4 and L4-5 listhesis when standing on radiography 08/16/2017.   Vertebrae: No fracture, evidence of discitis, or aggressive bone lesion.  Right eccentric L5 body hemangioma extending into the pedicle.   Conus medullaris and cauda equina: Conus extends to the L1 level. Conus and cauda equina appear normal.   Paraspinal and other soft tissues: Negative   Disc levels:   T12- L1: Unremarkable.   L1-L2: Unremarkable.   L2-L3: Disc bulging and facet hypertrophy. Best seen on axial T1 weighted imaging there is a right foraminal protrusion which could affect the L2 nerve root.   L3-L4:  Disc narrowing and bulging. Likely superimposed right foraminal protrusion. Posterior element hypertrophy with joint effusion. Moderate spinal stenosis. Moderate right foraminal narrowing.   L4-L5: Degenerative facet spurring. Mild annulus bulging. No impingement   L5-S1:Partial segmentation.  No degenerative changes or impingement   IMPRESSION: 1. Disc bulging and degenerative facet hypertrophy at L2-3 to L4-5. 2. L3-4 moderate spinal and right foraminal stenosis. This stenosis likely worsens with loading as there is dynamic L3-4 anterolisthesis on preceding radiography. 3. L2-3 right foraminal protrusion which could affect the L2 nerve root. 4. Incomplete segmentation of L5. 5. Motion degraded.     Electronically Signed   By: Marnee Spring M.D.   On: 03/13/2018 04:10

## 2018-03-15 NOTE — Addendum Note (Signed)
Addended byCaffie Damme on: 03/15/2018 12:06 PM   Modules accepted: Orders

## 2018-03-20 ENCOUNTER — Telehealth: Payer: Self-pay | Admitting: Radiology

## 2018-03-20 NOTE — Telephone Encounter (Signed)
-----   Message from Doristine Section sent at 03/20/2018 10:32 AM EST ----- Regarding: neuro appointment info Amy,  I spoke with [facility] re: referral appt to confirm, as they called patient yesterday, and patient was unable to recall when her appt was made.  She and caseworker called, so I entered all appt info into the referral (03/26/18, 11:15am/10:45 arrival, Dr Wynetta Emery)  Thanks, Okey Regal

## 2018-03-20 NOTE — Telephone Encounter (Signed)
Called case worker, she is aware of the appointment

## 2018-03-26 DIAGNOSIS — M5126 Other intervertebral disc displacement, lumbar region: Secondary | ICD-10-CM | POA: Diagnosis not present

## 2018-03-26 DIAGNOSIS — M48062 Spinal stenosis, lumbar region with neurogenic claudication: Secondary | ICD-10-CM | POA: Diagnosis not present

## 2018-04-02 DIAGNOSIS — Z6832 Body mass index (BMI) 32.0-32.9, adult: Secondary | ICD-10-CM | POA: Diagnosis not present

## 2018-04-02 DIAGNOSIS — M5126 Other intervertebral disc displacement, lumbar region: Secondary | ICD-10-CM | POA: Diagnosis not present

## 2018-04-02 DIAGNOSIS — E119 Type 2 diabetes mellitus without complications: Secondary | ICD-10-CM | POA: Diagnosis not present

## 2018-04-02 DIAGNOSIS — M48062 Spinal stenosis, lumbar region with neurogenic claudication: Secondary | ICD-10-CM | POA: Diagnosis not present

## 2018-04-03 ENCOUNTER — Telehealth: Payer: Self-pay | Admitting: Orthopedic Surgery

## 2018-04-03 NOTE — Telephone Encounter (Signed)
No, okay to cancel

## 2018-04-03 NOTE — Telephone Encounter (Signed)
Patient's social worker(Michelle) called asking if patient still needs to come on April 15th for her appointment since she was referred to [facility].  Please advise

## 2018-04-16 DIAGNOSIS — M5126 Other intervertebral disc displacement, lumbar region: Secondary | ICD-10-CM | POA: Diagnosis not present

## 2018-04-16 DIAGNOSIS — M48062 Spinal stenosis, lumbar region with neurogenic claudication: Secondary | ICD-10-CM | POA: Diagnosis not present

## 2018-05-08 ENCOUNTER — Ambulatory Visit: Payer: Medicare Other | Admitting: Orthopedic Surgery

## 2018-06-13 ENCOUNTER — Other Ambulatory Visit (HOSPITAL_COMMUNITY): Payer: Self-pay | Admitting: Family Medicine

## 2018-06-13 DIAGNOSIS — Z1231 Encounter for screening mammogram for malignant neoplasm of breast: Secondary | ICD-10-CM

## 2018-06-27 DIAGNOSIS — M48062 Spinal stenosis, lumbar region with neurogenic claudication: Secondary | ICD-10-CM | POA: Diagnosis not present

## 2018-07-01 DIAGNOSIS — L851 Acquired keratosis [keratoderma] palmaris et plantaris: Secondary | ICD-10-CM | POA: Diagnosis not present

## 2018-07-01 DIAGNOSIS — E1151 Type 2 diabetes mellitus with diabetic peripheral angiopathy without gangrene: Secondary | ICD-10-CM | POA: Diagnosis not present

## 2018-07-01 DIAGNOSIS — B351 Tinea unguium: Secondary | ICD-10-CM | POA: Diagnosis not present

## 2018-07-19 ENCOUNTER — Other Ambulatory Visit: Payer: Self-pay | Admitting: Orthopedic Surgery

## 2018-07-19 DIAGNOSIS — M4807 Spinal stenosis, lumbosacral region: Secondary | ICD-10-CM

## 2018-07-24 DIAGNOSIS — M48062 Spinal stenosis, lumbar region with neurogenic claudication: Secondary | ICD-10-CM | POA: Diagnosis not present

## 2018-07-24 DIAGNOSIS — M5126 Other intervertebral disc displacement, lumbar region: Secondary | ICD-10-CM | POA: Diagnosis not present

## 2018-09-12 DIAGNOSIS — Z136 Encounter for screening for cardiovascular disorders: Secondary | ICD-10-CM | POA: Diagnosis not present

## 2018-09-12 DIAGNOSIS — Z13220 Encounter for screening for lipoid disorders: Secondary | ICD-10-CM | POA: Diagnosis not present

## 2018-09-12 DIAGNOSIS — Z124 Encounter for screening for malignant neoplasm of cervix: Secondary | ICD-10-CM | POA: Diagnosis not present

## 2018-09-12 DIAGNOSIS — Z0000 Encounter for general adult medical examination without abnormal findings: Secondary | ICD-10-CM | POA: Diagnosis not present

## 2018-09-12 DIAGNOSIS — Z1159 Encounter for screening for other viral diseases: Secondary | ICD-10-CM | POA: Diagnosis not present

## 2018-09-12 DIAGNOSIS — R7302 Impaired glucose tolerance (oral): Secondary | ICD-10-CM | POA: Diagnosis not present

## 2018-09-12 DIAGNOSIS — Z1211 Encounter for screening for malignant neoplasm of colon: Secondary | ICD-10-CM | POA: Diagnosis not present

## 2018-09-12 DIAGNOSIS — Z7289 Other problems related to lifestyle: Secondary | ICD-10-CM | POA: Diagnosis not present

## 2018-09-23 DIAGNOSIS — B351 Tinea unguium: Secondary | ICD-10-CM | POA: Diagnosis not present

## 2018-09-23 DIAGNOSIS — E1151 Type 2 diabetes mellitus with diabetic peripheral angiopathy without gangrene: Secondary | ICD-10-CM | POA: Diagnosis not present

## 2018-09-23 DIAGNOSIS — L851 Acquired keratosis [keratoderma] palmaris et plantaris: Secondary | ICD-10-CM | POA: Diagnosis not present

## 2018-09-26 DIAGNOSIS — Z03818 Encounter for observation for suspected exposure to other biological agents ruled out: Secondary | ICD-10-CM | POA: Diagnosis not present

## 2018-10-08 DIAGNOSIS — Z03818 Encounter for observation for suspected exposure to other biological agents ruled out: Secondary | ICD-10-CM | POA: Diagnosis not present

## 2018-10-24 DIAGNOSIS — Z23 Encounter for immunization: Secondary | ICD-10-CM | POA: Diagnosis not present

## 2018-10-29 DIAGNOSIS — Z03818 Encounter for observation for suspected exposure to other biological agents ruled out: Secondary | ICD-10-CM | POA: Diagnosis not present

## 2018-11-04 DIAGNOSIS — M5412 Radiculopathy, cervical region: Secondary | ICD-10-CM | POA: Diagnosis not present

## 2018-11-04 DIAGNOSIS — M48062 Spinal stenosis, lumbar region with neurogenic claudication: Secondary | ICD-10-CM | POA: Diagnosis not present

## 2018-11-04 DIAGNOSIS — M5126 Other intervertebral disc displacement, lumbar region: Secondary | ICD-10-CM | POA: Diagnosis not present

## 2018-11-08 DIAGNOSIS — Z03818 Encounter for observation for suspected exposure to other biological agents ruled out: Secondary | ICD-10-CM | POA: Diagnosis not present

## 2018-11-14 DIAGNOSIS — M542 Cervicalgia: Secondary | ICD-10-CM | POA: Diagnosis not present

## 2018-11-14 DIAGNOSIS — M5412 Radiculopathy, cervical region: Secondary | ICD-10-CM | POA: Diagnosis not present

## 2018-11-19 DIAGNOSIS — M4802 Spinal stenosis, cervical region: Secondary | ICD-10-CM | POA: Diagnosis not present

## 2018-11-19 DIAGNOSIS — M48062 Spinal stenosis, lumbar region with neurogenic claudication: Secondary | ICD-10-CM | POA: Diagnosis not present

## 2018-11-19 DIAGNOSIS — M5412 Radiculopathy, cervical region: Secondary | ICD-10-CM | POA: Diagnosis not present

## 2018-11-20 DIAGNOSIS — M48062 Spinal stenosis, lumbar region with neurogenic claudication: Secondary | ICD-10-CM | POA: Diagnosis not present

## 2018-11-20 DIAGNOSIS — Z6825 Body mass index (BMI) 25.0-25.9, adult: Secondary | ICD-10-CM | POA: Diagnosis not present

## 2018-11-20 DIAGNOSIS — I10 Essential (primary) hypertension: Secondary | ICD-10-CM | POA: Diagnosis not present

## 2018-11-29 DIAGNOSIS — Z79891 Long term (current) use of opiate analgesic: Secondary | ICD-10-CM | POA: Diagnosis not present

## 2018-12-17 DIAGNOSIS — M48062 Spinal stenosis, lumbar region with neurogenic claudication: Secondary | ICD-10-CM | POA: Diagnosis not present

## 2018-12-17 DIAGNOSIS — M4802 Spinal stenosis, cervical region: Secondary | ICD-10-CM | POA: Diagnosis not present

## 2018-12-18 DIAGNOSIS — Z03818 Encounter for observation for suspected exposure to other biological agents ruled out: Secondary | ICD-10-CM | POA: Diagnosis not present

## 2019-02-19 DIAGNOSIS — Z03818 Encounter for observation for suspected exposure to other biological agents ruled out: Secondary | ICD-10-CM | POA: Diagnosis not present

## 2019-03-03 DIAGNOSIS — L851 Acquired keratosis [keratoderma] palmaris et plantaris: Secondary | ICD-10-CM | POA: Diagnosis not present

## 2019-03-03 DIAGNOSIS — E1151 Type 2 diabetes mellitus with diabetic peripheral angiopathy without gangrene: Secondary | ICD-10-CM | POA: Diagnosis not present

## 2019-03-03 DIAGNOSIS — B351 Tinea unguium: Secondary | ICD-10-CM | POA: Diagnosis not present

## 2019-03-19 DIAGNOSIS — I10 Essential (primary) hypertension: Secondary | ICD-10-CM | POA: Diagnosis not present

## 2019-03-19 DIAGNOSIS — M5136 Other intervertebral disc degeneration, lumbar region: Secondary | ICD-10-CM | POA: Diagnosis not present

## 2019-03-19 DIAGNOSIS — E119 Type 2 diabetes mellitus without complications: Secondary | ICD-10-CM | POA: Diagnosis not present

## 2019-04-09 DIAGNOSIS — M48062 Spinal stenosis, lumbar region with neurogenic claudication: Secondary | ICD-10-CM | POA: Diagnosis not present

## 2019-04-17 DIAGNOSIS — G5603 Carpal tunnel syndrome, bilateral upper limbs: Secondary | ICD-10-CM | POA: Diagnosis not present

## 2019-04-17 DIAGNOSIS — M4802 Spinal stenosis, cervical region: Secondary | ICD-10-CM | POA: Diagnosis not present

## 2019-04-22 ENCOUNTER — Other Ambulatory Visit: Payer: Self-pay | Admitting: Neurosurgery

## 2019-05-26 DIAGNOSIS — E1151 Type 2 diabetes mellitus with diabetic peripheral angiopathy without gangrene: Secondary | ICD-10-CM | POA: Diagnosis not present

## 2019-05-26 DIAGNOSIS — L851 Acquired keratosis [keratoderma] palmaris et plantaris: Secondary | ICD-10-CM | POA: Diagnosis not present

## 2019-05-26 DIAGNOSIS — B351 Tinea unguium: Secondary | ICD-10-CM | POA: Diagnosis not present

## 2019-06-04 NOTE — Progress Notes (Signed)
Your procedure is scheduled on Monday July 12.  Report to [facility] Main Entrance "A" at 05:30 A.M., and check in at the Admitting office.  Call this number if you have problems the morning of surgery: [phone]  Call [phone] if you have any questions prior to your surgery date Monday-Friday 8am-4pm   Remember: Do not eat or drink after midnight the night before your surgery  Take these medicines the morning of surgery with A SIP OF WATER: amitriptyline (ELAVIL)  busPIRone (BUSPAR) cetirizine (ZYRTEC)  gabapentin (NEURONTIN)  ranitidine (ZANTAC) simvastatin (ZOCOR)  venlafaxine XR (EFFEXOR-XR)  Follow your surgeon's instructions on when to stop Aspirin.  If no instructions were given by your surgeon then you will need to call the office to get those instructions.     7 days prior to surgery STOP taking any Aspirin containing products, Aleve, Naproxen, Ibuprofen, Motrin, Advil, Goody's, BC's, all herbal medications, fish oil, and all vitamins.   WHAT DO I DO ABOUT MY DIABETES MEDICATION?  . THE DAY BEFORE SURGERY -- take  metFORMIN (GLUCOPHAGE) as prescribed     . THE MORNING OF SURGERY -- DO NOT TAKE ANY ORAL DIABETIC MEDICATIONS including metFORMIN (GLUCOPHAGE)     HOW TO MANAGE YOUR DIABETES BEFORE AND AFTER SURGERY  Why is it important to control my blood sugar before and after surgery? . Improving blood sugar levels before and after surgery helps healing and can limit problems. . A way of improving blood sugar control is eating a healthy diet by: o  Eating less sugar and carbohydrates o  Increasing activity/exercise o  Talking with your doctor about reaching your blood sugar goals . High blood sugars (greater than 180 mg/dL) can raise your risk of infections and slow your recovery, so you will need to focus on controlling your diabetes during the weeks before surgery. . Make sure that the doctor who takes care of your diabetes knows about your planned  surgery including the date and location.  How do I manage my blood sugar before surgery? . Check your blood sugar at least 4 times a day, starting 2 days before surgery, to make sure that the level is not too high or low. . Check your blood sugar the morning of your surgery when you wake up and every 2 hours until you get to the Short Stay unit. o If your blood sugar is less than 70 mg/dL, you will need to treat for low blood sugar: - Do not take insulin. - Treat a low blood sugar (less than 70 mg/dL) with  cup of clear juice (cranberry or apple), 4 glucose tablets, OR glucose gel. - Recheck blood sugar in 15 minutes after treatment (to make sure it is greater than 70 mg/dL). If your blood sugar is not greater than 70 mg/dL on recheck, call [phone] for further instructions. . Report your blood sugar to the short stay nurse when you get to Short Stay.  . If you are admitted to the hospital after surgery: o Your blood sugar will be checked by the staff and you will probably be given insulin after surgery (instead of oral diabetes medicines) to make sure you have good blood sugar levels. o The goal for blood sugar control after surgery is 80-180 mg/dL.     The Morning of Surgery  Do not wear jewelry, make-up or nail polish.  Do not wear lotions, powders, or perfumes, or deodorant  Do not shave 48 hours prior to surgery.  Do not bring valuables to the hospital.  [facility] is not responsible for any belongings or valuables.  If you are a smoker, DO NOT Smoke 24 hours prior to surgery  If you wear a CPAP at night please bring your mask the morning of surgery   Remember that you must have someone to transport you home after your surgery, and remain with you for 24 hours if you are discharged the same day.   Please bring cases for contacts, glasses, hearing aids, dentures or bridgework because it cannot be worn into surgery.    Leave your suitcase in the car.  After surgery it may  be brought to your room.  For patients admitted to the hospital, discharge time will be determined by your treatment team.  Patients discharged the day of surgery will not be allowed to drive home.    Special instructions:   [place]- Preparing For Surgery  Before surgery, you can play an important role. Because skin is not sterile, your skin needs to be as free of germs as possible. You can reduce the number of germs on your skin by washing with CHG (chlorahexidine gluconate) Soap before surgery.  CHG is an antiseptic cleaner which kills germs and bonds with the skin to continue killing germs even after washing.    Oral Hygiene is also important to reduce your risk of infection.  Remember - BRUSH YOUR TEETH THE MORNING OF SURGERY WITH YOUR REGULAR TOOTHPASTE  Please do not use if you have an allergy to CHG or antibacterial soaps. If your skin becomes reddened/irritated stop using the CHG.  Do not shave (including legs and underarms) for at least 48 hours prior to first CHG shower. It is OK to shave your face.  Please follow these instructions carefully.   1. Shower the NIGHT BEFORE SURGERY and the MORNING OF SURGERY with CHG Soap.   2. If you chose to wash your hair and body, wash as usual with your normal shampoo and body-wash/soap.  3. Rinse your hair and body thoroughly to remove the shampoo and soap.  4. Apply CHG directly to the skin (ONLY FROM THE NECK DOWN) and wash gently with a scrungie or a clean washcloth.   5. Do not use on open wounds or open sores. Avoid contact with your eyes, ears, mouth and genitals (private parts). Wash Face and genitals (private parts)  with your normal soap.   6. Wash thoroughly, paying special attention to the area where your surgery will be performed.  7. Thoroughly rinse your body with warm water from the neck down.  8. DO NOT shower/wash with your normal soap after using and rinsing off the CHG Soap.  9. Pat yourself dry with a CLEAN  TOWEL.  10. Wear CLEAN PAJAMAS to bed the night before surgery  11. Place CLEAN SHEETS on your bed the night of your first shower and DO NOT SLEEP WITH PETS.  12. Wear comfortable clothes the morning of surgery.     Day of Surgery:  Please shower the morning of surgery with the CHG soap Do not apply any deodorants/lotions. Please wear clean clothes to the hospital/surgery center.   Remember to brush your teeth WITH YOUR REGULAR TOOTHPASTE.   Please read over the following fact sheets that you were given.

## 2019-06-05 ENCOUNTER — Other Ambulatory Visit (HOSPITAL_COMMUNITY): Payer: Medicare Other

## 2019-06-05 ENCOUNTER — Inpatient Hospital Stay (HOSPITAL_COMMUNITY)
Admission: RE | Admit: 2019-06-05 | Discharge: 2019-06-05 | Disposition: A | Discharge status: Home / Self Care | Payer: Medicare Other | Source: Ambulatory Visit

## 2019-06-22 ENCOUNTER — Telehealth: Payer: Self-pay | Admitting: Adult Health

## 2019-06-22 NOTE — Telephone Encounter (Signed)
I connected by phone with Leah King and/or patient's caregiver on 06/22/2019 at 8:44 AM to discuss the potential vaccination through our Homebound vaccination initiative.   Prevaccination Checklist for COVID-19 Vaccines  1.  Are you feeling sick today? cold, mild.  from weather  2.  Have you ever received a dose of a COVID-19 vaccine?  no      If yes, which one? None  After reviewing these two questions, our call ended.  Will try back later.   Lillard Anes, NP

## 2019-06-23 ENCOUNTER — Telehealth: Payer: Self-pay | Admitting: Adult Health

## 2019-06-23 NOTE — Telephone Encounter (Signed)
Attempted to call patient again to review screening for COVID 19 vaccine.  No voice mail set up.     Lillard Anes, NP

## 2019-06-24 ENCOUNTER — Telehealth: Payer: Self-pay | Admitting: Adult Health

## 2019-06-24 NOTE — Telephone Encounter (Signed)
Attempted to call patient about covid19 homebound vaccination effort with the moderna vaccine.  Her phone rang, no one answered, no voice mail.    Lillard Anes, NP

## 2019-07-01 DIAGNOSIS — H524 Presbyopia: Secondary | ICD-10-CM | POA: Diagnosis not present

## 2019-07-01 DIAGNOSIS — H2513 Age-related nuclear cataract, bilateral: Secondary | ICD-10-CM | POA: Diagnosis not present

## 2019-07-01 DIAGNOSIS — E119 Type 2 diabetes mellitus without complications: Secondary | ICD-10-CM | POA: Diagnosis not present

## 2019-07-01 DIAGNOSIS — Z7984 Long term (current) use of oral hypoglycemic drugs: Secondary | ICD-10-CM | POA: Diagnosis not present

## 2019-07-01 DIAGNOSIS — H52223 Regular astigmatism, bilateral: Secondary | ICD-10-CM | POA: Diagnosis not present

## 2019-07-01 DIAGNOSIS — H5203 Hypermetropia, bilateral: Secondary | ICD-10-CM | POA: Diagnosis not present

## 2019-07-14 ENCOUNTER — Other Ambulatory Visit: Payer: Self-pay | Admitting: Orthopedic Surgery

## 2019-07-14 DIAGNOSIS — M4807 Spinal stenosis, lumbosacral region: Secondary | ICD-10-CM

## 2019-08-04 ENCOUNTER — Ambulatory Visit (HOSPITAL_COMMUNITY): Admission: RE | Admit: 2019-08-04 | Payer: Medicare Other | Source: Home / Self Care | Admitting: Neurosurgery

## 2019-08-04 ENCOUNTER — Encounter (HOSPITAL_COMMUNITY): Admission: RE | Payer: Self-pay | Source: Home / Self Care

## 2019-08-04 SURGERY — CARPAL TUNNEL RELEASE
Anesthesia: General | Laterality: Right

## 2019-08-25 DIAGNOSIS — E1151 Type 2 diabetes mellitus with diabetic peripheral angiopathy without gangrene: Secondary | ICD-10-CM | POA: Diagnosis not present

## 2019-08-25 DIAGNOSIS — B351 Tinea unguium: Secondary | ICD-10-CM | POA: Diagnosis not present

## 2019-08-25 DIAGNOSIS — L851 Acquired keratosis [keratoderma] palmaris et plantaris: Secondary | ICD-10-CM | POA: Diagnosis not present

## 2019-10-07 DIAGNOSIS — Z23 Encounter for immunization: Secondary | ICD-10-CM | POA: Diagnosis not present

## 2019-10-30 DIAGNOSIS — M5412 Radiculopathy, cervical region: Secondary | ICD-10-CM | POA: Diagnosis not present

## 2019-11-04 DIAGNOSIS — Z23 Encounter for immunization: Secondary | ICD-10-CM | POA: Diagnosis not present

## 2019-11-24 DIAGNOSIS — B351 Tinea unguium: Secondary | ICD-10-CM | POA: Diagnosis not present

## 2019-11-24 DIAGNOSIS — L851 Acquired keratosis [keratoderma] palmaris et plantaris: Secondary | ICD-10-CM | POA: Diagnosis not present

## 2019-11-24 DIAGNOSIS — E1151 Type 2 diabetes mellitus with diabetic peripheral angiopathy without gangrene: Secondary | ICD-10-CM | POA: Diagnosis not present

## 2019-12-16 DIAGNOSIS — E119 Type 2 diabetes mellitus without complications: Secondary | ICD-10-CM | POA: Diagnosis not present

## 2019-12-16 DIAGNOSIS — Z136 Encounter for screening for cardiovascular disorders: Secondary | ICD-10-CM | POA: Diagnosis not present

## 2019-12-16 DIAGNOSIS — Z0000 Encounter for general adult medical examination without abnormal findings: Secondary | ICD-10-CM | POA: Diagnosis not present

## 2019-12-16 DIAGNOSIS — Z13220 Encounter for screening for lipoid disorders: Secondary | ICD-10-CM | POA: Diagnosis not present

## 2019-12-16 DIAGNOSIS — Z23 Encounter for immunization: Secondary | ICD-10-CM | POA: Diagnosis not present

## 2019-12-16 DIAGNOSIS — Z1211 Encounter for screening for malignant neoplasm of colon: Secondary | ICD-10-CM | POA: Diagnosis not present

## 2019-12-16 DIAGNOSIS — Z1231 Encounter for screening mammogram for malignant neoplasm of breast: Secondary | ICD-10-CM | POA: Diagnosis not present

## 2020-01-06 DIAGNOSIS — I10 Essential (primary) hypertension: Secondary | ICD-10-CM | POA: Diagnosis not present

## 2020-01-06 DIAGNOSIS — Z6831 Body mass index (BMI) 31.0-31.9, adult: Secondary | ICD-10-CM | POA: Diagnosis not present

## 2020-01-06 DIAGNOSIS — M48062 Spinal stenosis, lumbar region with neurogenic claudication: Secondary | ICD-10-CM | POA: Diagnosis not present

## 2020-04-06 ENCOUNTER — Other Ambulatory Visit: Payer: Self-pay | Admitting: Neurosurgery

## 2020-04-06 ENCOUNTER — Other Ambulatory Visit (HOSPITAL_COMMUNITY): Payer: Self-pay | Admitting: Neurosurgery

## 2020-04-06 DIAGNOSIS — M4802 Spinal stenosis, cervical region: Secondary | ICD-10-CM

## 2020-04-20 ENCOUNTER — Ambulatory Visit (HOSPITAL_COMMUNITY)
Admission: RE | Admit: 2020-04-20 | Discharge: 2020-04-20 | Disposition: A | Discharge status: Home / Self Care | Payer: Medicare Other | Source: Ambulatory Visit | Attending: Neurosurgery | Admitting: Neurosurgery

## 2020-04-20 DIAGNOSIS — M4802 Spinal stenosis, cervical region: Secondary | ICD-10-CM | POA: Insufficient documentation

## 2020-06-02 ENCOUNTER — Other Ambulatory Visit: Payer: Self-pay | Admitting: Neurosurgery

## 2020-06-30 NOTE — Pre-Procedure Instructions (Signed)
Surgical Instructions    Your procedure is scheduled on Monday, June 13th.  Report to [facility] Main Entrance "A" at 8:00 A.M., then check in with the Admitting office.  Call this number if you have problems the morning of surgery:  [phone]   If you have any questions prior to your surgery date call [phone]: Open Monday-Friday 8am-4pm    Remember:  Do not eat or drink after midnight the night before your surgery    Take these medicines the morning of surgery with A SIP OF WATER  busPIRone (BUSPAR)  cetirizine (ZYRTEC)  famotidine (PEPCID)  gabapentin (NEURONTIN) simvastatin (ZOCOR) venlafaxine XR (EFFEXOR-XR)   Follow your surgeon's instructions on when to stop Aspirin.  If no instructions were given by your surgeon then you will need to call the office to get those instructions.    As of today, STOP taking any Aspirin (unless otherwise instructed by your surgeon) Aleve, Naproxen, Ibuprofen, Motrin, Advil, Goody's, BC's, all herbal medications, fish oil, and all vitamins. This includes: celecoxib (CELEBREX).  WHAT DO I DO ABOUT MY DIABETES MEDICATION?   Marland Kitchen Do not take metFORMIN (GLUCOPHAGE) the morning of surgery.   HOW TO MANAGE YOUR DIABETES BEFORE AND AFTER SURGERY  Why is it important to control my blood sugar before and after surgery? . Improving blood sugar levels before and after surgery helps healing and can limit problems. . A way of improving blood sugar control is eating a healthy diet by: o  Eating less sugar and carbohydrates o  Increasing activity/exercise o  Talking with your doctor about reaching your blood sugar goals . High blood sugars (greater than 180 mg/dL) can raise your risk of infections and slow your recovery, so you will need to focus on controlling your diabetes during the weeks before surgery. . Make sure that the doctor who takes care of your diabetes knows about your planned surgery including the date and location.  How do I manage  my blood sugar before surgery? . Check your blood sugar at least 4 times a day, starting 2 days before surgery, to make sure that the level is not too high or low.  . Check your blood sugar the morning of your surgery when you wake up and every 2 hours until you get to the Short Stay unit.  o If your blood sugar is less than 70 mg/dL, you will need to treat for low blood sugar: - Do not take insulin. - Treat a low blood sugar (less than 70 mg/dL) with  cup of clear juice (cranberry or apple), 4 glucose tablets, OR glucose gel. - Recheck blood sugar in 15 minutes after treatment (to make sure it is greater than 70 mg/dL). If your blood sugar is not greater than 70 mg/dL on recheck, call [phone] for further instructions. . Report your blood sugar to the short stay nurse when you get to Short Stay.  . If you are admitted to the hospital after surgery: o Your blood sugar will be checked by the staff and you will probably be given insulin after surgery (instead of oral diabetes medicines) to make sure you have good blood sugar levels. o The goal for blood sugar control after surgery is 80-180 mg/dL.                       Do NOT Smoke (Tobacco/Vaping) or drink Alcohol 24 hours prior to your procedure.  If you use a CPAP at night, you may bring  all equipment for your overnight stay.   Contacts, glasses, piercing's, hearing aid's, dentures or partials may not be worn into surgery, please bring cases for these belongings.    For patients admitted to the hospital, discharge time will be determined by your treatment team.   Patients discharged the day of surgery will not be allowed to drive home, and someone needs to stay with them for 24 hours.    Special instructions:   [place]- Preparing For Surgery  Before surgery, you can play an important role. Because skin is not sterile, your skin needs to be as free of germs as possible. You can reduce the number of germs on your skin by  washing with CHG (chlorahexidine gluconate) Soap before surgery.  CHG is an antiseptic cleaner which kills germs and bonds with the skin to continue killing germs even after washing.    Oral Hygiene is also important to reduce your risk of infection.  Remember - BRUSH YOUR TEETH THE MORNING OF SURGERY WITH YOUR REGULAR TOOTHPASTE  Please do not use if you have an allergy to CHG or antibacterial soaps. If your skin becomes reddened/irritated stop using the CHG.  Do not shave (including legs and underarms) for at least 48 hours prior to first CHG shower. It is OK to shave your face.  Please follow these instructions carefully.   1. Shower the NIGHT BEFORE SURGERY and the MORNING OF SURGERY  2. If you chose to wash your hair, wash your hair first as usual with your normal shampoo.  3. After you shampoo, rinse your hair and body thoroughly to remove the shampoo.  4. Use CHG Soap as you would any other liquid soap. You can apply CHG directly to the skin and wash gently with a scrungie or a clean washcloth.   5. Apply the CHG Soap to your body ONLY FROM THE NECK DOWN.  Do not use on open wounds or open sores. Avoid contact with your eyes, ears, mouth and genitals (private parts). Wash Face and genitals (private parts)  with your normal soap.   6. Wash thoroughly, paying special attention to the area where your surgery will be performed.  7. Thoroughly rinse your body with warm water from the neck down.  8. DO NOT shower/wash with your normal soap after using and rinsing off the CHG Soap.  9. Pat yourself dry with a CLEAN TOWEL.  10. Wear CLEAN PAJAMAS to bed the night before surgery  11. Place CLEAN SHEETS on your bed the night before your surgery  12. DO NOT SLEEP WITH PETS.   Day of Surgery: Shower with CHG soap. Do not wear jewelry, make up, or nail polish Do not wear lotions, powders, perfumes, or deodorant. Do not shave 48 hours prior to surgery.   Do not bring valuables to the  hospital. [facility] is not responsible for any belongings or valuables. Wear Clean/Comfortable clothing the morning of surgery Remember to brush your teeth WITH YOUR REGULAR TOOTHPASTE.   Please read over the following fact sheets that you were given.

## 2020-07-01 ENCOUNTER — Inpatient Hospital Stay (HOSPITAL_COMMUNITY)
Admission: RE | Admit: 2020-07-01 | Discharge: 2020-07-01 | Disposition: A | Discharge status: Home / Self Care | Payer: Medicare Other | Source: Ambulatory Visit

## 2020-07-05 ENCOUNTER — Inpatient Hospital Stay (HOSPITAL_COMMUNITY): Admission: RE | Admit: 2020-07-05 | Payer: Medicare Other | Source: Home / Self Care | Admitting: Neurosurgery

## 2020-07-05 ENCOUNTER — Encounter (HOSPITAL_COMMUNITY): Admission: RE | Payer: Self-pay | Source: Home / Self Care

## 2020-07-05 SURGERY — ANTERIOR CERVICAL DECOMPRESSION/DISCECTOMY FUSION 3 LEVELS
Anesthesia: General

## 2020-12-21 ENCOUNTER — Other Ambulatory Visit: Payer: Self-pay | Admitting: Neurosurgery

## 2020-12-27 NOTE — Progress Notes (Signed)
Surgical Instructions    Your procedure is scheduled on 12/31/20.  Report to [facility] Main Entrance "A" at 8:10 A.M., then check in with the Admitting office.  Call this number if you have problems the morning of surgery:  [phone]   If you have any questions prior to your surgery date call [phone]: Open Monday-Friday 8am-4pm    Remember:  Do not eat after midnight the night before your surgery  You may drink clear liquids until 7:10 the morning of your surgery.   Clear liquids allowed are: Water, Non-Citrus Juices (without pulp), Carbonated Beverages, Clear Tea, Black Coffee ONLY (NO MILK, CREAM OR POWDERED CREAMER of any kind), and Gatorade    Take these medicines the morning of surgery with A SIP OF WATER:  busPIRone (BUSPAR)  cetirizine (ZYRTEC)  famotidine (PEPCID) gabapentin (NEURONTIN) simvastatin (ZOCOR) venlafaxine XR (EFFEXOR-XR)  As Needed: acetaminophen (TYLENOL)   As of today, STOP taking any Aspirin (unless otherwise instructed by your surgeon) celecoxib (CELEBREX) , Aleve, Naproxen, Ibuprofen, Motrin, Advil, Goody's, BC's, all herbal medications, fish oil, and all vitamins.   WHAT DO I DO ABOUT MY DIABETES MEDICATION?   Do not take oral diabetes medicines (pills) the morning of surgery.  Do NOT take Metformin day of surgery.     HOW TO MANAGE YOUR DIABETES BEFORE AND AFTER SURGERY  Why is it important to control my blood sugar before and after surgery? Improving blood sugar levels before and after surgery helps healing and can limit problems. A way of improving blood sugar control is eating a healthy diet by:  Eating less sugar and carbohydrates  Increasing activity/exercise  Talking with your doctor about reaching your blood sugar goals High blood sugars (greater than 180 mg/dL) can raise your risk of infections and slow your recovery, so you will need to focus on controlling your diabetes during the weeks before surgery. Make sure that the  doctor who takes care of your diabetes knows about your planned surgery including the date and location.  How do I manage my blood sugar before surgery? Check your blood sugar at least 4 times a day, starting 2 days before surgery, to make sure that the level is not too high or low.  Check your blood sugar the morning of your surgery when you wake up and every 2 hours until you get to the Short Stay unit.  If your blood sugar is less than 70 mg/dL, you will need to treat for low blood sugar: Do not take insulin. Treat a low blood sugar (less than 70 mg/dL) with  cup of clear juice (cranberry or apple), 4 glucose tablets, OR glucose gel. Recheck blood sugar in 15 minutes after treatment (to make sure it is greater than 70 mg/dL). If your blood sugar is not greater than 70 mg/dL on recheck, call [phone] for further instructions. Report your blood sugar to the short stay nurse when you get to Short Stay.  If you are admitted to the hospital after surgery: Your blood sugar will be checked by the staff and you will probably be given insulin after surgery (instead of oral diabetes medicines) to make sure you have good blood sugar levels. The goal for blood sugar control after surgery is 80-180 mg/dL.     After your COVID test   You are not required to quarantine however you are required to wear a well-fitting mask when you are out and around people not in your household.  If your mask becomes wet or  soiled, replace with a new one.  Wash your hands often with soap and water for 20 seconds or clean your hands with an alcohol-based hand sanitizer that contains at least 60% alcohol.  Do not share personal items.  Notify your provider: if you are in close contact with someone who has COVID  or if you develop a fever of 100.4 or greater, sneezing, cough, sore throat, shortness of breath or body aches.             Do not wear jewelry or makeup Do not wear lotions, powders, perfumes or  deodorant. Do not shave 48 hours prior to surgery.   Do not bring valuables to the hospital. DO Not wear nail polish, gel polish, artificial nails, or any other type of covering on natural nails including finger and toenails. If patients have artificial nails, gel coating, etc. that need to be removed by a nail salon, please have this removed prior to surgery or surgery may need to be canceled/delayed if the surgeon/ anesthesia feels like the patient is unable to be adequately monitored.             [facility] is not responsible for any belongings or valuables.  Do NOT Smoke (Tobacco/Vaping)  24 hours prior to your procedure  If you use a CPAP at night, you may bring your mask for your overnight stay.   Contacts, glasses, hearing aids, dentures or partials may not be worn into surgery, please bring cases for these belongings   For patients admitted to the hospital, discharge time will be determined by your treatment team.   Patients discharged the day of surgery will not be allowed to drive home, and someone needs to stay with them for 24 hours.  NO VISITORS WILL BE ALLOWED IN PRE-OP WHERE PATIENTS ARE PREPPED FOR SURGERY.  ONLY 1 SUPPORT PERSON MAY BE PRESENT IN THE WAITING ROOM WHILE YOU ARE IN SURGERY.  IF YOU ARE TO BE ADMITTED, ONCE YOU ARE IN YOUR ROOM YOU WILL BE ALLOWED TWO (2) VISITORS. 1 (ONE) VISITOR MAY STAY OVERNIGHT BUT MUST ARRIVE TO THE ROOM BY 8pm.  Minor children may have two parents present. Special consideration for safety and communication needs will be reviewed on a case by case basis.  Special instructions:    Oral Hygiene is also important to reduce your risk of infection.  Remember - BRUSH YOUR TEETH THE MORNING OF SURGERY WITH YOUR REGULAR TOOTHPASTE   [place]- Preparing For Surgery  Before surgery, you can play an important role. Because skin is not sterile, your skin needs to be as free of germs as possible. You can reduce the number of germs on your skin  by washing with CHG (chlorahexidine gluconate) Soap before surgery.  CHG is an antiseptic cleaner which kills germs and bonds with the skin to continue killing germs even after washing.     Please do not use if you have an allergy to CHG or antibacterial soaps. If your skin becomes reddened/irritated stop using the CHG.  Do not shave (including legs and underarms) for at least 48 hours prior to first CHG shower. It is OK to shave your face.  Please follow these instructions carefully.     Shower the NIGHT BEFORE SURGERY and the MORNING OF SURGERY with CHG Soap.   If you chose to wash your hair, wash your hair first as usual with your normal shampoo. After you shampoo, rinse your hair and body thoroughly to remove the shampoo.  Then [employer] and genitals (private parts) with your normal soap and rinse thoroughly to remove soap.  After that Use CHG Soap as you would any other liquid soap. You can apply CHG directly to the skin and wash gently with a scrungie or a clean washcloth.   Apply the CHG Soap to your body ONLY FROM THE NECK DOWN.  Do not use on open wounds or open sores. Avoid contact with your eyes, ears, mouth and genitals (private parts). Wash Face and genitals (private parts)  with your normal soap.   Wash thoroughly, paying special attention to the area where your surgery will be performed.  Thoroughly rinse your body with warm water from the neck down.  DO NOT shower/wash with your normal soap after using and rinsing off the CHG Soap.  Pat yourself dry with a CLEAN TOWEL.  Wear CLEAN PAJAMAS to bed the night before surgery  Place CLEAN SHEETS on your bed the night before your surgery  DO NOT SLEEP WITH PETS.   Day of Surgery:  Take a shower with CHG soap. Wear Clean/Comfortable clothing the morning of surgery Do not apply any deodorants/lotions.   Remember to brush your teeth WITH YOUR REGULAR TOOTHPASTE.   Please read over the following fact sheets that you were  given.  12

## 2020-12-28 ENCOUNTER — Inpatient Hospital Stay (HOSPITAL_COMMUNITY)
Admission: RE | Admit: 2020-12-28 | Discharge: 2020-12-28 | Disposition: A | Discharge status: Home / Self Care | Payer: Medicare Other | Source: Ambulatory Visit

## 2020-12-28 NOTE — Progress Notes (Signed)
Surgical Instructions    Your procedure is scheduled on Friday 12/31/20.  Report to [facility] Main Entrance "A" at 8:00 A.M., then check in with the Admitting office.  Call this number if you have problems the morning of surgery:  [phone]   If you have any questions prior to your surgery date call [phone]: Open Monday-Friday 8am-4pm    Remember:  Do not eat or drink after midnight the night before your surgery     Take these medicines the morning of surgery with A SIP OF WATER:  busPIRone (BUSPAR)  cetirizine (ZYRTEC)  famotidine (PEPCID) gabapentin (NEURONTIN) simvastatin (ZOCOR) venlafaxine XR (EFFEXOR-XR)  As Needed: acetaminophen (TYLENOL)   As of today, STOP taking any Aspirin (unless otherwise instructed by your surgeon) celecoxib (CELEBREX) , Aleve, Naproxen, Ibuprofen, Motrin, Advil, Goody's, BC's, all herbal medications, fish oil, and all vitamins.   WHAT DO I DO ABOUT MY DIABETES MEDICATION?   Do not take oral diabetes medicines (pills) the morning of surgery.  Do NOT take Metformin day of surgery.     HOW TO MANAGE YOUR DIABETES BEFORE AND AFTER SURGERY  Why is it important to control my blood sugar before and after surgery? Improving blood sugar levels before and after surgery helps healing and can limit problems. A way of improving blood sugar control is eating a healthy diet by:  Eating less sugar and carbohydrates  Increasing activity/exercise  Talking with your doctor about reaching your blood sugar goals High blood sugars (greater than 180 mg/dL) can raise your risk of infections and slow your recovery, so you will need to focus on controlling your diabetes during the weeks before surgery. Make sure that the doctor who takes care of your diabetes knows about your planned surgery including the date and location.  How do I manage my blood sugar before surgery? Check your blood sugar at least 4 times a day, starting 2 days before surgery, to  make sure that the level is not too high or low.  Check your blood sugar the morning of your surgery when you wake up and every 2 hours until you get to the Short Stay unit.  If your blood sugar is less than 70 mg/dL, you will need to treat for low blood sugar: Do not take insulin. Treat a low blood sugar (less than 70 mg/dL) with  cup of clear juice (cranberry or apple), 4 glucose tablets, OR glucose gel. Recheck blood sugar in 15 minutes after treatment (to make sure it is greater than 70 mg/dL). If your blood sugar is not greater than 70 mg/dL on recheck, call [phone] for further instructions. Report your blood sugar to the short stay nurse when you get to Short Stay.  If you are admitted to the hospital after surgery: Your blood sugar will be checked by the staff and you will probably be given insulin after surgery (instead of oral diabetes medicines) to make sure you have good blood sugar levels. The goal for blood sugar control after surgery is 80-180 mg/dL.     After your COVID test   You are not required to quarantine however you are required to wear a well-fitting mask when you are out and around people not in your household.  If your mask becomes wet or soiled, replace with a new one.  Wash your hands often with soap and water for 20 seconds or clean your hands with an alcohol-based hand sanitizer that contains at least 60% alcohol.  Do not share personal  items.  Notify your provider: if you are in close contact with someone who has COVID  or if you develop a fever of 100.4 or greater, sneezing, cough, sore throat, shortness of breath or body aches.             Do not wear jewelry or makeup Do not wear lotions, powders, perfumes or deodorant. Do not shave 48 hours prior to surgery.   Do not bring valuables to the hospital. DO Not wear nail polish, gel polish, artificial nails, or any other type of covering on natural nails including finger and toenails. If patients  have artificial nails, gel coating, etc. that need to be removed by a nail salon, please have this removed prior to surgery or surgery may need to be canceled/delayed if the surgeon/ anesthesia feels like the patient is unable to be adequately monitored.             [facility] is not responsible for any belongings or valuables.  Do NOT Smoke (Tobacco/Vaping)  24 hours prior to your procedure  If you use a CPAP at night, you may bring your mask for your overnight stay.   Contacts, glasses, hearing aids, dentures or partials may not be worn into surgery, please bring cases for these belongings   For patients admitted to the hospital, discharge time will be determined by your treatment team.   Patients discharged the day of surgery will not be allowed to drive home, and someone needs to stay with them for 24 hours.  NO VISITORS WILL BE ALLOWED IN PRE-OP WHERE PATIENTS ARE PREPPED FOR SURGERY.  ONLY 1 SUPPORT PERSON MAY BE PRESENT IN THE WAITING ROOM WHILE YOU ARE IN SURGERY.  IF YOU ARE TO BE ADMITTED, ONCE YOU ARE IN YOUR ROOM YOU WILL BE ALLOWED TWO (2) VISITORS. 1 (ONE) VISITOR MAY STAY OVERNIGHT BUT MUST ARRIVE TO THE ROOM BY 8pm.  Minor children may have two parents present. Special consideration for safety and communication needs will be reviewed on a case by case basis.  Special instructions:    Oral Hygiene is also important to reduce your risk of infection.  Remember - BRUSH YOUR TEETH THE MORNING OF SURGERY WITH YOUR REGULAR TOOTHPASTE   [place]- Preparing For Surgery  Before surgery, you can play an important role. Because skin is not sterile, your skin needs to be as free of germs as possible. You can reduce the number of germs on your skin by washing with CHG (chlorahexidine gluconate) Soap before surgery.  CHG is an antiseptic cleaner which kills germs and bonds with the skin to continue killing germs even after washing.     Please do not use if you have an allergy to CHG or  antibacterial soaps. If your skin becomes reddened/irritated stop using the CHG.  Do not shave (including legs and underarms) for at least 48 hours prior to first CHG shower. It is OK to shave your face.  Please follow these instructions carefully.     Shower the NIGHT BEFORE SURGERY and the MORNING OF SURGERY with CHG Soap.   If you chose to wash your hair, wash your hair first as usual with your normal shampoo. After you shampoo, rinse your hair and body thoroughly to remove the shampoo.  Then [employer] and genitals (private parts) with your normal soap and rinse thoroughly to remove soap.  After that Use CHG Soap as you would any other liquid soap. You can apply CHG directly to the  skin and wash gently with a scrungie or a clean washcloth.   Apply the CHG Soap to your body ONLY FROM THE NECK DOWN.  Do not use on open wounds or open sores. Avoid contact with your eyes, ears, mouth and genitals (private parts). Wash Face and genitals (private parts)  with your normal soap.   Wash thoroughly, paying special attention to the area where your surgery will be performed.  Thoroughly rinse your body with warm water from the neck down.  DO NOT shower/wash with your normal soap after using and rinsing off the CHG Soap.  Pat yourself dry with a CLEAN TOWEL.  Wear CLEAN PAJAMAS to bed the night before surgery  Place CLEAN SHEETS on your bed the night before your surgery  DO NOT SLEEP WITH PETS.   Day of Surgery:  Take a shower with CHG soap. Wear Clean/Comfortable clothing the morning of surgery Do not apply any deodorants/lotions.   Remember to brush your teeth WITH YOUR REGULAR TOOTHPASTE.   Please read over the following fact sheets that you were given.  12

## 2021-01-25 NOTE — Progress Notes (Signed)
Surgical Instructions    Your procedure is scheduled on 01/28/21.  [facility] at 8:30 A.M., then check in with the Admitting office.  Call this number if you have problems the morning of surgery:  [phone]   If you have any questions prior to your surgery date call [phone]: Open Monday-Friday 8am-4pm    Remember:  Do not eat or drink after midnight the night before your surgery     Take these medicines the morning of surgery with A SIP OF WATER: busPIRone (BUSPAR)  cetirizine (ZYRTEC)  Dextromethorphan-guaiFENesin (MUCINEX DM) famotidine (PEPCID)  gabapentin (NEURONTIN) NAPHCON-A - eye drops simvastatin (ZOCOR)  venlafaxine XR (EFFEXOR-XR)   IF NEEDED; acetaminophen (TYLENOL)   As of today, STOP taking any Aspirin (unless otherwise instructed by your surgeon) celecoxib (CELEBREX), Aleve, Naproxen, Ibuprofen, Motrin, Advil, Goody's, BC's, all herbal medications, fish oil, and all vitamins.  WHAT DO I DO ABOUT MY DIABETES MEDICATION?   Do not take oral diabetes medicines (pills) the morning of surgery.  DO NOT take Metformin day of surgery.    HOW TO MANAGE YOUR DIABETES BEFORE AND AFTER SURGERY  Why is it important to control my blood sugar before and after surgery? Improving blood sugar levels before and after surgery helps healing and can limit problems. A way of improving blood sugar control is eating a healthy diet by:  Eating less sugar and carbohydrates  Increasing activity/exercise  Talking with your doctor about reaching your blood sugar goals High blood sugars (greater than 180 mg/dL) can raise your risk of infections and slow your recovery, so you will need to focus on controlling your diabetes during the weeks before surgery. Make sure that the doctor who takes care of your diabetes knows about your planned surgery including the date and location.  How do I manage my blood sugar before surgery? Check your blood sugar at  least 4 times a day, starting 2 days before surgery, to make sure that the level is not too high or low.  Check your blood sugar the morning of your surgery when you wake up and every 2 hours until you get to the Short Stay unit.  If your blood sugar is less than 70 mg/dL, you will need to treat for low blood sugar: Do not take insulin. Treat a low blood sugar (less than 70 mg/dL) with  cup of clear juice (cranberry or apple), 4 glucose tablets, OR glucose gel. Recheck blood sugar in 15 minutes after treatment (to make sure it is greater than 70 mg/dL). If your blood sugar is not greater than 70 mg/dL on recheck, call [phone] for further instructions. Report your blood sugar to the short stay nurse when you get to Short Stay.  If you are admitted to the hospital after surgery: Your blood sugar will be checked by the staff and you will probably be given insulin after surgery (instead of oral diabetes medicines) to make sure you have good blood sugar levels. The goal for blood sugar control after surgery is 80-180 mg/dL.      After your COVID test   You are not required to quarantine however you are required to wear a well-fitting mask when you are out and around people not in your household.  If your mask becomes wet or soiled, replace with a new one.  Wash your hands often with soap and water for 20 seconds or clean your hands with an alcohol-based hand sanitizer that contains at least 60%  alcohol.  Do not share personal items.  Notify your provider: if you are in close contact with someone who has COVID  or if you develop a fever of 100.4 or greater, sneezing, cough, sore throat, shortness of breath or body aches.           Do not wear jewelry or makeup Do not wear lotions, powders, perfumes  or deodorant. Do not shave 48 hours prior to surgery.   Do not bring valuables to the hospital. DO Not wear nail polish, gel polish, artificial nails, or any other type of covering on  natural nails including finger and toenails. If patients have artificial nails, gel coating, etc. that need to be removed by a nail salon, please have this removed prior to surgery or surgery may need to be canceled/delayed if the surgeon/ anesthesia feels like the patient is unable to be adequately monitored.             [facility] is not responsible for any belongings or valuables.  Do NOT Smoke (Tobacco/Vaping)  24 hours prior to your procedure  If you use a CPAP at night, you may bring your mask for your overnight stay.   Contacts, glasses, hearing aids, dentures or partials may not be worn into surgery, please bring cases for these belongings   For patients admitted to the hospital, discharge time will be determined by your treatment team.   Patients discharged the day of surgery will not be allowed to drive home, and someone needs to stay with them for 24 hours.  NO VISITORS WILL BE ALLOWED IN PRE-OP WHERE PATIENTS ARE PREPPED FOR SURGERY.  ONLY 1 SUPPORT PERSON MAY BE PRESENT IN THE WAITING ROOM WHILE YOU ARE IN SURGERY.  IF YOU ARE TO BE ADMITTED, ONCE YOU ARE IN YOUR ROOM YOU WILL BE ALLOWED TWO (2) VISITORS. 1 (ONE) VISITOR MAY STAY OVERNIGHT BUT MUST ARRIVE TO THE ROOM BY 8pm.  Minor children may have two parents present. Special consideration for safety and communication needs will be reviewed on a case by case basis.  Special instructions:    Oral Hygiene is also important to reduce your risk of infection.  Remember - BRUSH YOUR TEETH THE MORNING OF SURGERY WITH YOUR REGULAR TOOTHPASTE   [facility]- Preparing For Surgery  Before surgery, you can play an important role. Because skin is not sterile, your skin needs to be as free of germs as possible. You can reduce the number of germs on your skin by washing with CHG (chlorahexidine gluconate) Soap before surgery.  CHG is an antiseptic cleaner which kills germs and bonds with the skin to continue killing germs even after washing.      Please do not use if you have an allergy to CHG or antibacterial soaps. If your skin becomes reddened/irritated stop using the CHG.  Do not shave (including legs and underarms) for at least 48 hours prior to first CHG shower. It is OK to shave your face.  Please follow these instructions carefully.     Shower the NIGHT BEFORE SURGERY and the MORNING OF SURGERY with CHG Soap.   If you chose to wash your hair, wash your hair first as usual with your normal shampoo. After you shampoo, rinse your hair and body thoroughly to remove the shampoo.  Then [employer] and genitals (private parts) with your normal soap and rinse thoroughly to remove soap.  After that Use CHG Soap as you would any other liquid soap. You can  apply CHG directly to the skin and wash gently with a scrungie or a clean washcloth.   Apply the CHG Soap to your body ONLY FROM THE NECK DOWN.  Do not use on open wounds or open sores. Avoid contact with your eyes, ears, mouth and genitals (private parts). Wash Face and genitals (private parts)  with your normal soap.   Wash thoroughly, paying special attention to the area where your surgery will be performed.  Thoroughly rinse your body with warm water from the neck down.  DO NOT shower/wash with your normal soap after using and rinsing off the CHG Soap.  Pat yourself dry with a CLEAN TOWEL.  Wear CLEAN PAJAMAS to bed the night before surgery  Place CLEAN SHEETS on your bed the night before your surgery  DO NOT SLEEP WITH PETS.   Day of Surgery:  Take a shower with CHG soap. Wear Clean/Comfortable clothing the morning of surgery Do not apply any deodorants/lotions.   Remember to brush your teeth WITH YOUR REGULAR TOOTHPASTE.   Please read over the following fact sheets that you were given.

## 2021-01-26 ENCOUNTER — Encounter (HOSPITAL_COMMUNITY)
Admission: RE | Admit: 2021-01-26 | Discharge: 2021-01-26 | Disposition: A | Discharge status: Home / Self Care | Payer: Medicare Other | Source: Ambulatory Visit | Attending: Neurosurgery | Admitting: Neurosurgery

## 2021-01-26 ENCOUNTER — Other Ambulatory Visit: Payer: Self-pay

## 2021-01-26 ENCOUNTER — Encounter (HOSPITAL_COMMUNITY): Payer: Self-pay

## 2021-01-26 VITALS — BP 166/99 | HR 80 | Temp 97.6°F | Resp 19 | Ht 64.0 in | Wt 192.0 lb

## 2021-01-26 DIAGNOSIS — Z01818 Encounter for other preprocedural examination: Secondary | ICD-10-CM | POA: Diagnosis not present

## 2021-01-26 DIAGNOSIS — I10 Essential (primary) hypertension: Secondary | ICD-10-CM | POA: Diagnosis not present

## 2021-01-26 DIAGNOSIS — E119 Type 2 diabetes mellitus without complications: Secondary | ICD-10-CM | POA: Insufficient documentation

## 2021-01-26 DIAGNOSIS — Z20822 Contact with and (suspected) exposure to covid-19: Secondary | ICD-10-CM | POA: Insufficient documentation

## 2021-01-26 LAB — BASIC METABOLIC PANEL
Anion gap: 5 (ref 5–15)
BUN: 18 mg/dL (ref 8–23)
CO2: 24 mmol/L (ref 22–32)
Calcium: 10.6 mg/dL — ABNORMAL HIGH (ref 8.9–10.3)
Chloride: 112 mmol/L — ABNORMAL HIGH (ref 98–111)
Creatinine, Ser: 1 mg/dL (ref 0.44–1.00)
GFR, Estimated: >60 mL/min (ref >60)
Glucose, Bld: 106 mg/dL — ABNORMAL HIGH (ref 70–99)
Potassium: 4.1 mmol/L (ref 3.5–5.1)
Sodium: 141 mmol/L (ref 135–145)

## 2021-01-26 LAB — SURGICAL PCR SCREEN
MRSA, PCR: NEGATIVE
Staphylococcus aureus: POSITIVE — AB

## 2021-01-26 LAB — CBC
HCT: 43.2 % (ref 36.0–46.0)
Hemoglobin: 13.6 g/dL (ref 12.0–15.0)
MCH: 31.9 pg (ref 26.0–34.0)
MCHC: 31.5 g/dL (ref 30.0–36.0)
MCV: 101.4 fL — ABNORMAL HIGH (ref 80.0–100.0)
Platelets: 227 10*3/uL (ref 150–400)
RBC: 4.26 MIL/uL (ref 3.87–5.11)
RDW: 12.1 % (ref 11.5–15.5)
WBC: 6.1 10*3/uL (ref 4.0–10.5)
nRBC: 0 % (ref 0.0–0.2)

## 2021-01-26 LAB — GLUCOSE, CAPILLARY: Glucose-Capillary: 149 mg/dL — ABNORMAL HIGH (ref 70–99)

## 2021-01-26 LAB — TYPE AND SCREEN
ABO/RH(D): A POS
Antibody Screen: NEGATIVE

## 2021-01-26 LAB — HEMOGLOBIN A1C
Hgb A1c MFr Bld: 6.8 % — ABNORMAL HIGH (ref 4.8–5.6)
Mean Plasma Glucose: 148.46 mg/dL

## 2021-01-26 NOTE — Progress Notes (Signed)
PCP - Dr. Sundra Aland Cardiologist - denies  PPM/ICD - denies   Chest x-ray - denies EKG - 01/26/21 at PAT Stress Test - denies ECHO - denies Cardiac Cath - denies  Sleep Study - denies  DM- Type 2 Fasting Blood Sugar - pt unsure Checks Blood Sugar every 2-3 days  Blood Thinner Instructions: n/a Aspirin Instructions: pt stopped taking ASA on Sunday 01/23/21  ERAS Protcol - no, NPO   COVID TEST- 01/26/21 at PAT   Anesthesia review: no  Patient denies shortness of breath, fever, cough and chest pain at PAT appointment   All instructions explained to the patient, with a verbal understanding of the material. Patient agrees to go over the instructions while at home for a better understanding. Patient also instructed to wear a mask in public after being tested for COVID-19. The opportunity to ask questions was provided.

## 2021-01-27 LAB — SARS CORONAVIRUS 2 (TAT 6-24 HRS): SARS Coronavirus 2: NEGATIVE

## 2021-01-28 ENCOUNTER — Ambulatory Visit (HOSPITAL_COMMUNITY)
Admission: RE | Admit: 2021-01-28 | Discharge: 2021-01-29 | Disposition: A | Discharge status: Home / Self Care | Payer: Medicare Other | Attending: Neurosurgery | Admitting: Neurosurgery

## 2021-01-28 ENCOUNTER — Encounter (HOSPITAL_COMMUNITY): Payer: Self-pay | Admitting: Neurosurgery

## 2021-01-28 ENCOUNTER — Encounter (HOSPITAL_COMMUNITY)
Admission: RE | Disposition: A | Discharge status: Home / Self Care | Payer: Self-pay | Source: Home / Self Care | Attending: Neurosurgery

## 2021-01-28 ENCOUNTER — Ambulatory Visit (HOSPITAL_COMMUNITY): Payer: Medicare Other | Admitting: Anesthesiology

## 2021-01-28 ENCOUNTER — Other Ambulatory Visit: Payer: Self-pay

## 2021-01-28 ENCOUNTER — Ambulatory Visit (HOSPITAL_COMMUNITY): Payer: Medicare Other

## 2021-01-28 DIAGNOSIS — Z09 Encounter for follow-up examination after completed treatment for conditions other than malignant neoplasm: Secondary | ICD-10-CM

## 2021-01-28 DIAGNOSIS — Z7984 Long term (current) use of oral hypoglycemic drugs: Secondary | ICD-10-CM | POA: Diagnosis not present

## 2021-01-28 DIAGNOSIS — M4802 Spinal stenosis, cervical region: Secondary | ICD-10-CM | POA: Diagnosis not present

## 2021-01-28 DIAGNOSIS — M4712 Other spondylosis with myelopathy, cervical region: Secondary | ICD-10-CM | POA: Diagnosis not present

## 2021-01-28 DIAGNOSIS — E119 Type 2 diabetes mellitus without complications: Secondary | ICD-10-CM | POA: Insufficient documentation

## 2021-01-28 DIAGNOSIS — G959 Disease of spinal cord, unspecified: Secondary | ICD-10-CM | POA: Diagnosis present

## 2021-01-28 DIAGNOSIS — F17210 Nicotine dependence, cigarettes, uncomplicated: Secondary | ICD-10-CM | POA: Insufficient documentation

## 2021-01-28 HISTORY — PX: ANTERIOR CERVICAL DECOMP/DISCECTOMY FUSION: SHX1161

## 2021-01-28 LAB — GLUCOSE, CAPILLARY
Glucose-Capillary: 132 mg/dL — ABNORMAL HIGH (ref 70–99)
Glucose-Capillary: 146 mg/dL — ABNORMAL HIGH (ref 70–99)
Glucose-Capillary: 256 mg/dL — ABNORMAL HIGH (ref 70–99)
Glucose-Capillary: 234 mg/dL — ABNORMAL HIGH (ref 70–99)

## 2021-01-28 LAB — ABO/RH: ABO/RH(D): A POS

## 2021-01-28 SURGERY — ANTERIOR CERVICAL DECOMPRESSION/DISCECTOMY FUSION 2 LEVELS
Anesthesia: General

## 2021-01-28 MED ORDER — ACETAMINOPHEN 500 MG PO TABS: 500.0000 mg | ORAL_TABLET | Freq: Four times a day (QID) | ORAL | Status: DC | PRN

## 2021-01-28 MED ORDER — HYDROCODONE-ACETAMINOPHEN 5-325 MG PO TABS
2.0000 | ORAL_TABLET | ORAL | Status: DC | PRN
  Administered 2021-01-28 – 2021-01-29 (×4): 2 via ORAL
  Filled 2021-01-28 (×4): qty 2

## 2021-01-28 MED ORDER — ACETAMINOPHEN 650 MG RE SUPP: 650.0000 mg | RECTAL | Status: DC | PRN

## 2021-01-28 MED ORDER — PANTOPRAZOLE SODIUM 40 MG IV SOLR: 40.0000 mg | Freq: Every day | INTRAVENOUS | Status: DC

## 2021-01-28 MED ORDER — CEFAZOLIN SODIUM-DEXTROSE 2-4 GM/100ML-% IV SOLN
2.0000 g | INTRAVENOUS | Status: AC
  Administered 2021-01-28: 2 g via INTRAVENOUS

## 2021-01-28 MED ORDER — ONDANSETRON HCL 4 MG/2ML IJ SOLN: 4.0000 mg | Freq: Four times a day (QID) | INTRAMUSCULAR | Status: DC | PRN

## 2021-01-28 MED ORDER — LACTATED RINGERS IV SOLN: INTRAVENOUS | Status: DC

## 2021-01-28 MED ORDER — ONDANSETRON HCL 4 MG/2ML IJ SOLN
INTRAMUSCULAR | Status: AC
  Filled 2021-01-28: qty 2

## 2021-01-28 MED ORDER — PANTOPRAZOLE SODIUM 40 MG PO TBEC: 40.0000 mg | DELAYED_RELEASE_TABLET | Freq: Every day | ORAL | Status: DC

## 2021-01-28 MED ORDER — ORAL CARE MOUTH RINSE: 15.0000 mL | Freq: Once | OROMUCOSAL | Status: AC

## 2021-01-28 MED ORDER — HEMOSTATIC AGENTS (NO CHARGE) OPTIME
TOPICAL | Status: DC | PRN
  Administered 2021-01-28: 1 via TOPICAL

## 2021-01-28 MED ORDER — ACETAMINOPHEN 325 MG PO TABS: 650.0000 mg | ORAL_TABLET | ORAL | Status: DC | PRN

## 2021-01-28 MED ORDER — FENTANYL CITRATE (PF) 250 MCG/5ML IJ SOLN
INTRAMUSCULAR | Status: AC
  Filled 2021-01-28: qty 5

## 2021-01-28 MED ORDER — THROMBIN 5000 UNITS EX SOLR
CUTANEOUS | Status: AC
  Filled 2021-01-28: qty 15000

## 2021-01-28 MED ORDER — LORATADINE 10 MG PO TABS
10.0000 mg | ORAL_TABLET | Freq: Every day | ORAL | Status: DC
  Administered 2021-01-28: 10 mg via ORAL
  Filled 2021-01-28: qty 1

## 2021-01-28 MED ORDER — ACETAMINOPHEN 500 MG PO TABS: 1000.0000 mg | ORAL_TABLET | Freq: Once | ORAL | Status: AC

## 2021-01-28 MED ORDER — VENLAFAXINE HCL ER 75 MG PO CP24
75.0000 mg | ORAL_CAPSULE | Freq: Every day | ORAL | Status: DC
  Administered 2021-01-29: 75 mg via ORAL

## 2021-01-28 MED ORDER — LOSARTAN POTASSIUM 50 MG PO TABS
100.0000 mg | ORAL_TABLET | Freq: Every day | ORAL | Status: DC
  Administered 2021-01-28: 100 mg via ORAL
  Filled 2021-01-28: qty 2

## 2021-01-28 MED ORDER — ROCURONIUM BROMIDE 10 MG/ML (PF) SYRINGE
PREFILLED_SYRINGE | INTRAVENOUS | Status: DC | PRN
Start: 2021-01-28 — End: 2021-01-28
  Administered 2021-01-28: 60 mg via INTRAVENOUS
  Administered 2021-01-28: 40 mg via INTRAVENOUS

## 2021-01-28 MED ORDER — NAPHAZOLINE-PHENIRAMINE 0.025-0.3 % OP SOLN
1.0000 [drp] | Freq: Four times a day (QID) | OPHTHALMIC | Status: DC | PRN
  Filled 2021-01-28: qty 15

## 2021-01-28 MED ORDER — FAMOTIDINE 20 MG PO TABS
20.0000 mg | ORAL_TABLET | Freq: Two times a day (BID) | ORAL | Status: DC
  Administered 2021-01-28: 20 mg via ORAL
  Filled 2021-01-28 (×2): qty 1

## 2021-01-28 MED ORDER — CHLORHEXIDINE GLUCONATE CLOTH 2 % EX PADS: 6.0000 | MEDICATED_PAD | Freq: Once | CUTANEOUS | Status: DC

## 2021-01-28 MED ORDER — THROMBIN 5000 UNITS EX SOLR
CUTANEOUS | Status: DC | PRN
  Administered 2021-01-28 (×2): 5000 [IU] via TOPICAL

## 2021-01-28 MED ORDER — HYDROMORPHONE HCL 1 MG/ML IJ SOLN: 0.5000 mg | INTRAMUSCULAR | Status: DC | PRN

## 2021-01-28 MED ORDER — FENTANYL CITRATE (PF) 250 MCG/5ML IJ SOLN
INTRAMUSCULAR | Status: DC | PRN
  Administered 2021-01-28: 100 ug via INTRAVENOUS
  Administered 2021-01-28 (×2): 50 ug via INTRAVENOUS

## 2021-01-28 MED ORDER — FENTANYL CITRATE (PF) 100 MCG/2ML IJ SOLN: 25.0000 ug | INTRAMUSCULAR | Status: DC | PRN

## 2021-01-28 MED ORDER — SODIUM CHLORIDE 0.9% FLUSH: 3.0000 mL | INTRAVENOUS | Status: DC | PRN

## 2021-01-28 MED ORDER — PROPOFOL 10 MG/ML IV BOLUS
INTRAVENOUS | Status: AC
  Filled 2021-01-28: qty 20

## 2021-01-28 MED ORDER — 0.9 % SODIUM CHLORIDE (POUR BTL) OPTIME
TOPICAL | Status: DC | PRN
  Administered 2021-01-28: 1000 mL

## 2021-01-28 MED ORDER — SODIUM CHLORIDE 0.9% FLUSH
3.0000 mL | Freq: Two times a day (BID) | INTRAVENOUS | Status: DC
  Administered 2021-01-28: 3 mL via INTRAVENOUS

## 2021-01-28 MED ORDER — METFORMIN HCL 500 MG PO TABS
500.0000 mg | ORAL_TABLET | Freq: Two times a day (BID) | ORAL | Status: DC
  Administered 2021-01-28 – 2021-01-29 (×2): 500 mg via ORAL
  Filled 2021-01-28: qty 1

## 2021-01-28 MED ORDER — PROMETHAZINE HCL 25 MG/ML IJ SOLN: 6.2500 mg | INTRAMUSCULAR | Status: DC | PRN

## 2021-01-28 MED ORDER — INSULIN ASPART 100 UNIT/ML IJ SOLN
0.0000 [IU] | Freq: Three times a day (TID) | INTRAMUSCULAR | Status: DC
  Administered 2021-01-28: 8 [IU] via SUBCUTANEOUS

## 2021-01-28 MED ORDER — ACETAMINOPHEN 500 MG PO TABS
ORAL_TABLET | ORAL | Status: AC
  Administered 2021-01-28: 1000 mg via ORAL
  Filled 2021-01-28: qty 2

## 2021-01-28 MED ORDER — CELECOXIB 100 MG PO CAPS
100.0000 mg | ORAL_CAPSULE | Freq: Every day | ORAL | Status: DC
  Administered 2021-01-28: 100 mg via ORAL
  Filled 2021-01-28 (×2): qty 1

## 2021-01-28 MED ORDER — CHLORHEXIDINE GLUCONATE 0.12 % MT SOLN: 15.0000 mL | Freq: Once | OROMUCOSAL | Status: AC

## 2021-01-28 MED ORDER — PHENOL 1.4 % MT LIQD: 1.0000 | OROMUCOSAL | Status: DC | PRN

## 2021-01-28 MED ORDER — HYDROCHLOROTHIAZIDE 25 MG PO TABS
25.0000 mg | ORAL_TABLET | Freq: Every day | ORAL | Status: DC
  Administered 2021-01-28: 25 mg via ORAL
  Filled 2021-01-28: qty 1

## 2021-01-28 MED ORDER — EPHEDRINE 5 MG/ML INJ
INTRAVENOUS | Status: AC
  Filled 2021-01-28: qty 5

## 2021-01-28 MED ORDER — BUSPIRONE HCL 5 MG PO TABS
5.0000 mg | ORAL_TABLET | Freq: Three times a day (TID) | ORAL | Status: DC
  Administered 2021-01-28: 5 mg via ORAL
  Filled 2021-01-28 (×3): qty 1

## 2021-01-28 MED ORDER — AMITRIPTYLINE HCL 50 MG PO TABS
50.0000 mg | ORAL_TABLET | Freq: Every evening | ORAL | Status: DC | PRN
  Filled 2021-01-28: qty 1

## 2021-01-28 MED ORDER — MIDAZOLAM HCL 2 MG/2ML IJ SOLN
INTRAMUSCULAR | Status: AC
  Filled 2021-01-28: qty 2

## 2021-01-28 MED ORDER — ONDANSETRON HCL 4 MG PO TABS: 4.0000 mg | ORAL_TABLET | Freq: Four times a day (QID) | ORAL | Status: DC | PRN

## 2021-01-28 MED ORDER — ASPIRIN EC 81 MG PO TBEC: 81.0000 mg | DELAYED_RELEASE_TABLET | Freq: Every day | ORAL | Status: DC

## 2021-01-28 MED ORDER — EPHEDRINE SULFATE-NACL 50-0.9 MG/10ML-% IV SOSY
PREFILLED_SYRINGE | INTRAVENOUS | Status: DC | PRN
  Administered 2021-01-28: 10 mg via INTRAVENOUS

## 2021-01-28 MED ORDER — LIDOCAINE 2% (20 MG/ML) 5 ML SYRINGE
INTRAMUSCULAR | Status: AC
  Filled 2021-01-28: qty 5

## 2021-01-28 MED ORDER — SIMVASTATIN 20 MG PO TABS: 40.0000 mg | ORAL_TABLET | Freq: Every day | ORAL | Status: DC

## 2021-01-28 MED ORDER — DEXAMETHASONE SODIUM PHOSPHATE 10 MG/ML IJ SOLN
10.0000 mg | Freq: Once | INTRAMUSCULAR | Status: AC
  Administered 2021-01-28: 10 mg via INTRAVENOUS

## 2021-01-28 MED ORDER — CHLORHEXIDINE GLUCONATE 0.12 % MT SOLN
OROMUCOSAL | Status: AC
  Administered 2021-01-28: 15 mL via OROMUCOSAL
  Filled 2021-01-28: qty 15

## 2021-01-28 MED ORDER — CEFAZOLIN SODIUM-DEXTROSE 2-4 GM/100ML-% IV SOLN
INTRAVENOUS | Status: AC
  Filled 2021-01-28: qty 100

## 2021-01-28 MED ORDER — GABAPENTIN 400 MG PO CAPS
400.0000 mg | ORAL_CAPSULE | Freq: Three times a day (TID) | ORAL | Status: DC
  Administered 2021-01-28: 400 mg via ORAL
  Filled 2021-01-28 (×2): qty 1

## 2021-01-28 MED ORDER — CYCLOBENZAPRINE HCL 10 MG PO TABS
10.0000 mg | ORAL_TABLET | Freq: Three times a day (TID) | ORAL | Status: DC | PRN
  Administered 2021-01-28: 10 mg via ORAL
  Filled 2021-01-28 (×2): qty 1

## 2021-01-28 MED ORDER — LIDOCAINE 2% (20 MG/ML) 5 ML SYRINGE
INTRAMUSCULAR | Status: DC | PRN
  Administered 2021-01-28: 80 mg via INTRAVENOUS

## 2021-01-28 MED ORDER — SUGAMMADEX SODIUM 200 MG/2ML IV SOLN
INTRAVENOUS | Status: DC | PRN
Start: 2021-01-28 — End: 2021-01-28
  Administered 2021-01-28: 200 mg via INTRAVENOUS

## 2021-01-28 MED ORDER — DM-GUAIFENESIN ER 30-600 MG PO TB12
1.0000 | ORAL_TABLET | Freq: Two times a day (BID) | ORAL | Status: DC
  Filled 2021-01-28 (×2): qty 1

## 2021-01-28 MED ORDER — MUSCLE RUB 10-15 % EX CREA
1.0000 "application " | TOPICAL_CREAM | CUTANEOUS | Status: DC | PRN
  Filled 2021-01-28: qty 85

## 2021-01-28 MED ORDER — ROCURONIUM BROMIDE 10 MG/ML (PF) SYRINGE
PREFILLED_SYRINGE | INTRAVENOUS | Status: AC
  Filled 2021-01-28: qty 10

## 2021-01-28 MED ORDER — THROMBIN 5000 UNITS EX SOLR: OROMUCOSAL | Status: DC | PRN

## 2021-01-28 MED ORDER — ALUM & MAG HYDROXIDE-SIMETH 200-200-20 MG/5ML PO SUSP: 30.0000 mL | Freq: Four times a day (QID) | ORAL | Status: DC | PRN

## 2021-01-28 MED ORDER — CEFAZOLIN SODIUM-DEXTROSE 2-4 GM/100ML-% IV SOLN
2.0000 g | Freq: Three times a day (TID) | INTRAVENOUS | Status: AC
  Administered 2021-01-28 – 2021-01-29 (×2): 2 g via INTRAVENOUS
  Filled 2021-01-28 (×2): qty 100

## 2021-01-28 MED ORDER — DEXAMETHASONE SODIUM PHOSPHATE 10 MG/ML IJ SOLN
INTRAMUSCULAR | Status: AC
  Filled 2021-01-28: qty 1

## 2021-01-28 MED ORDER — PROPOFOL 10 MG/ML IV BOLUS
INTRAVENOUS | Status: DC | PRN
  Administered 2021-01-28: 140 mg via INTRAVENOUS

## 2021-01-28 MED ORDER — ONDANSETRON HCL 4 MG/2ML IJ SOLN
INTRAMUSCULAR | Status: DC | PRN
  Administered 2021-01-28: 4 mg via INTRAVENOUS

## 2021-01-28 MED ORDER — MENTHOL 3 MG MT LOZG: 1.0000 | LOZENGE | OROMUCOSAL | Status: DC | PRN

## 2021-01-28 MED ORDER — SODIUM CHLORIDE 0.9 % IV SOLN
250.0000 mL | INTRAVENOUS | Status: DC
  Administered 2021-01-28: 250 mL via INTRAVENOUS

## 2021-01-28 MED ORDER — MIDAZOLAM HCL 2 MG/2ML IJ SOLN
INTRAMUSCULAR | Status: DC | PRN
  Administered 2021-01-28: 2 mg via INTRAVENOUS

## 2021-01-28 SURGICAL SUPPLY — 90 items
BAG COUNTER SPONGE SURGICOUNT (BAG) ×6 IMPLANT
BAND RUBBER #18 3X1/16 STRL (MISCELLANEOUS) ×6 IMPLANT
BASKET BONE COLLECTION (BASKET) ×3 IMPLANT
BENZOIN TINCTURE PRP APPL 2/3 (GAUZE/BANDAGES/DRESSINGS) ×3 IMPLANT
BIT DRILL NEURO 2X3.1 SFT TUCH (MISCELLANEOUS) ×2 IMPLANT
BLADE SURG 15 STRL LF DISP TIS (BLADE) ×2 IMPLANT
BLADE SURG 15 STRL SS (BLADE) ×1
BNDG ELASTIC 3X5.8 VLCR STR LF (GAUZE/BANDAGES/DRESSINGS) ×3 IMPLANT
BNDG GAUZE ELAST 4 BULKY (GAUZE/BANDAGES/DRESSINGS) ×1 IMPLANT
BONE VIVIGEN FORMABLE 1.3CC (Bone Implant) ×3 IMPLANT
BUR MATCHSTICK NEURO 3.0 LAGG (BURR) ×3 IMPLANT
CABLE BIPOLOR RESECTION CORD (MISCELLANEOUS) ×1 IMPLANT
CANISTER SUCT 3000ML PPV (MISCELLANEOUS) ×6 IMPLANT
CARTRIDGE OIL MAESTRO DRILL (MISCELLANEOUS) ×4 IMPLANT
DECANTER SPIKE VIAL GLASS SM (MISCELLANEOUS) ×3 IMPLANT
DERMABOND ADVANCED (GAUZE/BANDAGES/DRESSINGS) ×1
DERMABOND ADVANCED .7 DNX12 (GAUZE/BANDAGES/DRESSINGS) ×1 IMPLANT
DIFFUSER DRILL AIR PNEUMATIC (MISCELLANEOUS) ×6 IMPLANT
DRAPE C-ARM 42X72 X-RAY (DRAPES) ×6 IMPLANT
DRAPE EXTREMITY T 121X128X90 (DISPOSABLE) ×1 IMPLANT
DRAPE HALF SHEET 40X57 (DRAPES) ×1 IMPLANT
DRAPE LAPAROTOMY 100X72 PEDS (DRAPES) ×3 IMPLANT
DRAPE MICROSCOPE LEICA (MISCELLANEOUS) ×3 IMPLANT
DRILL NEURO 2X3.1 SOFT TOUCH (MISCELLANEOUS) ×3
DRSG OPSITE POSTOP 4X6 (GAUZE/BANDAGES/DRESSINGS) ×2 IMPLANT
DURAPREP 6ML APPLICATOR 50/CS (WOUND CARE) ×3 IMPLANT
ELECT COATED BLADE 2.86 ST (ELECTRODE) ×3 IMPLANT
ELECT REM PT RETURN 9FT ADLT (ELECTROSURGICAL) ×3
ELECTRODE REM PT RTRN 9FT ADLT (ELECTROSURGICAL) ×2 IMPLANT
GAUZE 4X4 16PLY ~~LOC~~+RFID DBL (SPONGE) ×3 IMPLANT
GAUZE SPONGE 4X4 12PLY STRL (GAUZE/BANDAGES/DRESSINGS) ×6 IMPLANT
GLOVE EXAM NITRILE XL STR (GLOVE) IMPLANT
GLOVE OPTIFIT SS 8.0 STRL (GLOVE) IMPLANT
GLOVE SURG ENC MOIS LTX SZ6.5 (GLOVE) IMPLANT
GLOVE SURG ENC MOIS LTX SZ7 (GLOVE) ×6 IMPLANT
GLOVE SURG ENC MOIS LTX SZ7.5 (GLOVE) ×2 IMPLANT
GLOVE SURG ENC MOIS LTX SZ8 (GLOVE) ×6 IMPLANT
GLOVE SURG ENC MOIS LTX SZ8.5 (GLOVE) ×2 IMPLANT
GLOVE SURG ENC TEXT LTX SZ8 (GLOVE) IMPLANT
GLOVE SURG LTX SZ6.5 (GLOVE) IMPLANT
GLOVE SURG LTX SZ7 (GLOVE) IMPLANT
GLOVE SURG LTX SZ7.5 (GLOVE) IMPLANT
GLOVE SURG LTX SZ8 (GLOVE) IMPLANT
GLOVE SURG LTX SZ8.5 (GLOVE) IMPLANT
GLOVE SURG POLYISO LF SZ6.5 (GLOVE) IMPLANT
GLOVE SURG UNDER LTX SZ6.5 (GLOVE) IMPLANT
GLOVE SURG UNDER LTX SZ7 (GLOVE) IMPLANT
GLOVE SURG UNDER LTX SZ7.5 (GLOVE) IMPLANT
GLOVE SURG UNDER LTX SZ8 (GLOVE) IMPLANT
GLOVE SURG UNDER LTX SZ8.5 (GLOVE) ×6 IMPLANT
GLOVE SURG UNDER POLY LF SZ7 (GLOVE) IMPLANT
GOWN STRL REUS W/ TWL LRG LVL3 (GOWN DISPOSABLE) ×2 IMPLANT
GOWN STRL REUS W/ TWL XL LVL3 (GOWN DISPOSABLE) ×4 IMPLANT
GOWN STRL REUS W/TWL 2XL LVL3 (GOWN DISPOSABLE) ×3 IMPLANT
GOWN STRL REUS W/TWL LRG LVL3 (GOWN DISPOSABLE) ×1
GOWN STRL REUS W/TWL XL LVL3 (GOWN DISPOSABLE) ×2
GRAFT BNE MATRIX VG FRMBL SM 1 (Bone Implant) IMPLANT
HALTER HD/CHIN CERV TRACTION D (MISCELLANEOUS) ×3 IMPLANT
HAND ALUMI LG (SOFTGOODS) ×1 IMPLANT
HEMOSTAT POWDER KIT SURGIFOAM (HEMOSTASIS) ×3 IMPLANT
KIT BASIN OR (CUSTOM PROCEDURE TRAY) ×4 IMPLANT
KIT TURNOVER KIT B (KITS) ×4 IMPLANT
NDL HYPO 25X1 1.5 SAFETY (NEEDLE) IMPLANT
NDL SPNL 20GX3.5 QUINCKE YW (NEEDLE) ×1 IMPLANT
NEEDLE HYPO 25X1 1.5 SAFETY (NEEDLE) IMPLANT
NEEDLE SPNL 20GX3.5 QUINCKE YW (NEEDLE) ×3 IMPLANT
NS IRRIG 1000ML POUR BTL (IV SOLUTION) ×6 IMPLANT
OIL CARTRIDGE MAESTRO DRILL (MISCELLANEOUS) ×6
PACK LAMINECTOMY NEURO (CUSTOM PROCEDURE TRAY) ×3 IMPLANT
PACK SURGICAL SETUP 50X90 (CUSTOM PROCEDURE TRAY) ×1 IMPLANT
PAD ARMBOARD 7.5X6 YLW CONV (MISCELLANEOUS) ×15 IMPLANT
PIN DISTRACTION 14MM (PIN) IMPLANT
PLATE CERV RES 30 2L (Plate) ×2 IMPLANT
SCREW VA SD RESONATE 4.2X14 (Screw) ×12 IMPLANT
SPACER C HEDRON 12X14 7M 7D (Spacer) ×2 IMPLANT
SPACER HEDRON C 12X14X7 0D (Spacer) ×2 IMPLANT
SPONGE INTESTINAL PEANUT (DISPOSABLE) ×3 IMPLANT
SPONGE SURGIFOAM ABS GEL SZ50 (HEMOSTASIS) ×3 IMPLANT
STOCKINETTE 4X48 STRL (DRAPES) ×1 IMPLANT
STRIP CLOSURE SKIN 1/2X4 (GAUZE/BANDAGES/DRESSINGS) ×3 IMPLANT
SUT ETHILON 3 0 PS 1 (SUTURE) ×1 IMPLANT
SUT VIC AB 3-0 SH 8-18 (SUTURE) ×3 IMPLANT
SUT VICRYL 4-0 PS2 18IN ABS (SUTURE) ×3 IMPLANT
SYR BULB EAR ULCER 3OZ GRN STR (SYRINGE) ×1 IMPLANT
SYR CONTROL 10ML LL (SYRINGE) IMPLANT
TAPE CLOTH 4X10 WHT NS (GAUZE/BANDAGES/DRESSINGS) ×3 IMPLANT
TOWEL GREEN STERILE (TOWEL DISPOSABLE) ×6 IMPLANT
TOWEL GREEN STERILE FF (TOWEL DISPOSABLE) ×6 IMPLANT
UNDERPAD 30X36 HEAVY ABSORB (UNDERPADS AND DIAPERS) ×3 IMPLANT
WATER STERILE IRR 1000ML POUR (IV SOLUTION) ×6 IMPLANT

## 2021-01-28 NOTE — H&P (Signed)
Leah King is an 66 y.o. female.   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  We had recommended a two-level ACDF at C3-4 and C4-5 as well as carpal tunnel release but she does not want to proceed forward with a carpal tunnel release today so we will cancel that piece and proceed only with the two-level cervical.  We have extensively talked over the risks and benefits of the operation with the patient as well as perioperative course expectations of outcome and alternatives to surgery and she understands and agrees to proceed forward.  Past Medical History:  Diagnosis Date   Anxiety    Arthritis    back and legs   Depression    Diabetes mellitus without complication (HCC)    GERD (gastroesophageal reflux disease)    Hyperlipidemia    Hypertension     Past Surgical History:  Procedure Laterality Date   SKIN GRAFT FULL THICKNESS ARM      Family History  Problem Relation Age of Onset   Alcohol abuse Mother    Heart disease Father    Hypertension Son    Hypertension Daughter    Diabetes Daughter    Alzheimer's disease Maternal Grandmother    Diabetes Maternal Grandfather    Social History:  reports that she has been smoking cigarettes. She started smoking about 31 years ago. She has been smoking an average of .25 packs per day. She has never used smokeless tobacco. She reports that she does not drink alcohol and does not use drugs.  Allergies: No Known Allergies  Medications Prior to Admission  Medication Sig Dispense Refill   acetaminophen (TYLENOL) 500 MG tablet Take 500 mg by mouth every 6 (six) hours as needed for moderate pain.     amitriptyline (ELAVIL) 50 MG tablet Take 50 mg by mouth at bedtime as needed (pain).     aspirin EC  81 MG tablet Take 1 tablet (81 mg total) by mouth daily. 30 tablet 11   busPIRone (BUSPAR) 5 MG tablet Take 1 tablet (5 mg total) by mouth 3 (three) times daily. 90 tablet 11   celecoxib (CELEBREX) 100 MG capsule Take 100 mg by mouth daily.     cetirizine (ZYRTEC) 10 MG tablet Take 1 tablet (10 mg total) by mouth daily. 30 tablet 11   Dextromethorphan-guaiFENesin (MUCINEX DM) 30-600 MG TB12 Take 1 tablet by mouth every 12 (twelve) hours.     famotidine (PEPCID) 20 MG tablet Take 20 mg by mouth 2 (two) times daily.     gabapentin (NEURONTIN) 400 MG capsule TAKE (1) CAPSULE BY MOUTH FOUR TIMES DAILY. (Patient taking differently: Take 400 mg by mouth 3 (three) times daily.) 112 capsule 11   hydrochlorothiazide (HYDRODIURIL) 25 MG tablet Take 25 mg by mouth daily.     losartan (COZAAR) 100 MG tablet Take 100 mg by mouth daily.     metFORMIN (GLUCOPHAGE) 500 MG tablet Take 1 tablet (500 mg total) by mouth 2 (two) times daily with a meal. 60 tablet 11   NAPHCON-A 0.025-0.3 % ophthalmic solution Place 1-2 drops into both eyes in the morning, at noon, in the evening, and at bedtime.     simvastatin (ZOCOR) 40 MG tablet Take 1 tablet (40 mg total) by mouth daily. 30 tablet 11  venlafaxine XR (EFFEXOR-XR) 75 MG 24 hr capsule Take 1 capsule (75 mg total) by mouth daily with breakfast. 30 capsule 11   blood glucose meter kit and supplies KIT Dispense based on patient and insurance preference. 1 each 0   trolamine salicylate (ASPERCREME) 10 % cream Apply 1 application topically as needed for muscle pain.      Results for orders placed or performed during the hospital encounter of 01/28/21 (from the past 48 hour(s))  Glucose, capillary     Status: Abnormal   Collection Time: 01/28/21  9:07 AM  Result Value Ref Range   Glucose-Capillary 132 (H) 70 - 99 mg/dL    Comment: Glucose reference range applies only to samples taken after fasting for at least 8 hours.  ABO/Rh     Status: None (Preliminary result)    Collection Time: 01/28/21  9:15 AM  Result Value Ref Range   ABO/RH(D) PENDING    No results found.  Review of Systems  Musculoskeletal:  Positive for neck pain.  Neurological:  Positive for weakness and numbness.   Blood pressure (!) 157/80, pulse 67, temperature 98.2 F (36.8 C), temperature source Oral, resp. rate 17, height 5' 3"  (1.6 m), weight 99.3 kg, SpO2 98 %. Physical Exam HENT:     Head: Normocephalic.     Right Ear: Tympanic membrane normal.     Nose: Nose normal.     Mouth/Throat:     Mouth: Mucous membranes are moist.  Cardiovascular:     Rate and Rhythm: Normal rate.     Pulses: Normal pulses.  Pulmonary:     Effort: Pulmonary effort is normal.  Abdominal:     General: Abdomen is flat.  Musculoskeletal:        General: Normal range of motion.  Skin:    General: Skin is warm.  Neurological:     Mental Status: She is alert.     Comments: Awake and alert strength is 5 of 5 deltoid, bicep, tricep, wrist flexion, wrist extension, hand intrinsics.     Assessment/Plan 66 year old female presents for ACDF C3-4 C4-5  Elaina Hoops, MD 01/28/2021, 9:58 AM

## 2021-01-28 NOTE — Transfer of Care (Signed)
Immediate Anesthesia Transfer of Care Note  Patient: Leah King  Procedure(s) Performed: ACDF - C3-C4 - C4-C5  Patient Location: PACU  Anesthesia Type:General  Level of Consciousness: drowsy and patient cooperative  Airway & Oxygen Therapy: Patient Spontanous Breathing  Post-op Assessment: Report given to RN and Post -op Vital signs reviewed and stable  Post vital signs: Reviewed and stable  Last Vitals:  Vitals Value Taken Time  BP 166/86 01/28/21 1250  Temp    Pulse 88 01/28/21 1251  Resp 24 01/28/21 1250  SpO2 88 % 01/28/21 1251  Vitals shown include unvalidated device data.  Last Pain:  Vitals:   01/28/21 0922  TempSrc:   PainSc: 7          Complications: No notable events documented.

## 2021-01-28 NOTE — Anesthesia Procedure Notes (Signed)
Procedure Name: Intubation Date/Time: 01/28/2021 10:49 AM Performed by: Rosiland Oz, CRNA Pre-anesthesia Checklist: Patient identified, Emergency Drugs available, Suction available, Patient being monitored and Timeout performed Patient Re-evaluated:Patient Re-evaluated prior to induction Oxygen Delivery Method: Circle system utilized Preoxygenation: Pre-oxygenation with 100% oxygen Induction Type: IV induction Ventilation: Mask ventilation without difficulty Laryngoscope Size: Miller and 3 Grade View: Grade I Tube type: Oral Tube size: 7.0 mm Number of attempts: 1 Airway Equipment and Method: Stylet Placement Confirmation: ETT inserted through vocal cords under direct vision, positive ETCO2 and breath sounds checked- equal and bilateral Secured at: 21 cm Tube secured with: Tape Dental Injury: Teeth and Oropharynx as per pre-operative assessment

## 2021-01-28 NOTE — Anesthesia Postprocedure Evaluation (Signed)
Anesthesia Post Note  Patient: Leah King  Procedure(s) Performed: ACDF - C3-C4 - C4-C5     Patient location during evaluation: PACU Anesthesia Type: General Level of consciousness: awake and alert Pain management: pain level controlled Vital Signs Assessment: post-procedure vital signs reviewed and stable Respiratory status: spontaneous breathing, nonlabored ventilation and respiratory function stable Cardiovascular status: blood pressure returned to baseline and stable Postop Assessment: no apparent nausea or vomiting Anesthetic complications: no   No notable events documented.  Last Vitals:  Vitals:   01/28/21 1335 01/28/21 1357  BP: (!) 157/80 (!) 175/93  Pulse: 67 (!) 58  Resp: 20 18  Temp:  36.9 C  SpO2: 99% 97%    Last Pain:  Vitals:   01/28/21 1357  TempSrc: Oral  PainSc:                  Cecile Hearing

## 2021-01-28 NOTE — Anesthesia Preprocedure Evaluation (Addendum)
Anesthesia Evaluation  Patient identified by MRN, date of birth, ID band Patient awake    Reviewed: Allergy & Precautions, NPO status , Patient's Chart, lab work & pertinent test results  Airway Mallampati: III  TM Distance: >3 FB Neck ROM: Limited    Dental  (+) Dental Advisory Given, Missing   Pulmonary Current Smoker and Patient abstained from smoking.,    Pulmonary exam normal breath sounds clear to auscultation       Cardiovascular hypertension, Pt. on medications (-) anginaNormal cardiovascular exam Rhythm:Regular Rate:Normal     Neuro/Psych PSYCHIATRIC DISORDERS Anxiety Depression negative neurological ROS     GI/Hepatic Neg liver ROS, GERD  Medicated,  Endo/Other  diabetes, Type 2, Oral Hypoglycemic AgentsObesity   Renal/GU negative Renal ROS     Musculoskeletal  (+) Arthritis ,   Abdominal   Peds  Hematology negative hematology ROS (+)   Anesthesia Other Findings Day of surgery medications reviewed with the patient.  Reproductive/Obstetrics                            Anesthesia Physical Anesthesia Plan  ASA: 2  Anesthesia Plan: General   Post-op Pain Management: Tylenol PO (pre-op)   Induction: Intravenous  PONV Risk Score and Plan: 2 and Midazolam, Dexamethasone and Ondansetron  Airway Management Planned: Oral ETT and Video Laryngoscope Planned  Additional Equipment:   Intra-op Plan:   Post-operative Plan: Extubation in OR  Informed Consent: I have reviewed the patients History and Physical, chart, labs and discussed the procedure including the risks, benefits and alternatives for the proposed anesthesia with the patient or authorized representative who has indicated his/her understanding and acceptance.     Dental advisory given  Plan Discussed with: CRNA  Anesthesia Plan Comments:         Anesthesia Quick Evaluation

## 2021-01-28 NOTE — Op Note (Signed)
Preoperative diagnosis: Cervical spondylitic myelopathy from severe cervical stenosis with cord compression C3-4 C4-5  Postoperative diagnosis: Same  Procedure: Anterior cervical discectomy and fusion at C3-4 and C4-5 utilizing the [place] titanium cages packed with locally harvested autograft mixed with vivigen and anterior cervical plating utilizing the globus resonate plating system  Surgeon: Jillyn Hidden Amal Renbarger  Assistant: Julien Girt  Anesthesia: General  EBL: Minimal  HPI: 66 year old female progressive worsening neck pain bilateral shoulder pain numbness tingling weakness in her hands work-up revealed severe cord compression and signal change within the cord and spinal stenosis at C3-4 and C4-5.  Due to patient's progression of clinical syndrome imaging findings and failed conservative treatment I recommended a two-level anterior cervical discectomy and fusion.  I extensively went over the risks and benefits of the operation with her as well as perioperative course expectations of outcome and alternatives to surgery and she understood and agreed to proceed forward.  Operative procedure: Patient brought into the OR was Duson general anesthesia positioned supine the neck in slight extension 5 pounds halter traction.  The right side of her neck neck was prepped and draped in routine sterile fashion.  Preoperative x-ray localized the appropriate level.  So a curvilinear incision was made just off the midline to the entry border of the sternocleidomastoid and superficial platysma was dissected out divided longitudinally.  The avascular plane between the sternomastoid and strap muscle was developed down to the prevertebral fascia and prevertebral fascia was dissected away with Kitners.  Intraoperative x-ray confirmed identification appropriate level so anterior osteophytes were bitten off with a Leksell rongeur annulotomy was made with a 15 blade scalpel to mark the disc base longus goes reflected laterally  and self-retaining retractors were placed.  Both disc bases were drilled down capturing the bone shavings and mucus trap and under microscopic lamination first working at C3-4 disc base was further drilled down large posterior osteophytes coming off the endplates of the C3 and C4 vertebral bodies were aggressively under Bitton marching laterally both C4 pedicles were identified and both C4 nerve roots were skeletonized flush with pedicle.  At the end of this discectomy there is no further stenosis either centrally or foraminally this was packed with Gelfoam tension taken at C3-4 5.  In a similar fashion C4-5 was drilled down again pathology was primarily endplate osteophytes these were all aggressively under Bitton decompressing central canal and both C5 nerve roots were identified decompressed skeletonized flush with the pedicle.  I incised up to interbody implants a 7 mm lordotic at C3-4 a 7 mm parallel at C4-5 inserted both of these and then placed a 30 mm globus resonate plate all screws had excellent purchase packs and additional bone graft laterally to the cages Parth prior to plate placement.  Then the was copiously irrigated with 6 hemostasis was maintained the wound was closed in layers with active Vicryl running 4 subcuticular Dermabond benzoin Steri-Strips and a sterile dressing was applied and patient recovery in stable condition.  At the end the case all needle counts and sponge counts were correct.

## 2021-01-29 DIAGNOSIS — M4802 Spinal stenosis, cervical region: Secondary | ICD-10-CM | POA: Diagnosis not present

## 2021-01-29 LAB — GLUCOSE, CAPILLARY: Glucose-Capillary: 118 mg/dL — ABNORMAL HIGH (ref 70–99)

## 2021-01-29 MED ORDER — HYDROCODONE-ACETAMINOPHEN 5-325 MG PO TABS: 2.0000 | ORAL_TABLET | ORAL | 0 refills | Status: AC | PRN

## 2021-01-29 NOTE — Progress Notes (Signed)
Patient alert and oriented, mae's well, voiding adequate amount of urine, swallowing without difficulty, no c/o pain at time of discharge. Patient discharged home with family. Script and discharged instructions given to patient. Patient and family stated understanding of instructions given. Patient has an appointment with Dr. Cram 

## 2021-01-29 NOTE — Discharge Summary (Signed)
Physician Discharge Summary  Patient ID: Leah King MRN: [id number] DOB/AGE: Apr 11, 1955 66 y.o.  Admit date: 01/28/2021 Discharge date: 01/29/2021  Admission Diagnoses: Cervical myelopathy  Discharge Diagnoses: The same Principal Problem:   Myelopathy [facility])   Discharged Condition: good  Hospital Course: Dr. Saintclair Halsted performed a C3-4 and C4-5 anterior cervical discectomy, fusion and plating on the patient on 01/28/2021.  The patient's postoperative course was unremarkable.  On postoperative day 1 she requested discharge to home.  The patient, and her daughter, given verbal and written discharge instructions.  All her questions were answered.  Consults: PT, OT, care management Significant Diagnostic Studies: None Treatments: C3-4 and C4-5 anterior cervicectomy, fusion and plating. Discharge Exam: Blood pressure (!) 82/69, pulse 70, temperature 97.7 F (36.5 C), temperature source Oral, resp. rate 18, height _0  (1.6 m), weight 99.3 kg, SpO2 95 %. The patient is alert and pleasant.  She looks well.  Her dressing is clean and dry.  There is no hematoma or shift.  Her strength is normal.  Disposition: Home  Discharge Instructions     Call MD for:  difficulty breathing, headache or visual disturbances   Complete by: As directed    Call MD for:  extreme fatigue   Complete by: As directed    Call MD for:  hives   Complete by: As directed    Call MD for:  persistant dizziness or light-headedness   Complete by: As directed    Call MD for:  persistant nausea and vomiting   Complete by: As directed    Call MD for:  redness, tenderness, or signs of infection (pain, swelling, redness, odor or green/yellow discharge around incision site)   Complete by: As directed    Call MD for:  severe uncontrolled pain   Complete by: As directed    Call MD for:  temperature >100.4   Complete by: As directed    Diet - low sodium heart healthy   Complete by: As directed    Discharge instructions    Complete by: As directed    Call [phone] for a followup appointment. Take a stool softener while you are using pain medications.   Driving Restrictions   Complete by: As directed    Do not drive for 2 weeks.   Increase activity slowly   Complete by: As directed    Lifting restrictions   Complete by: As directed    Do not lift more than 5 pounds. No excessive bending or twisting.   May shower / Bathe   Complete by: As directed    Remove the dressing for 3 days after surgery.  You may shower, but leave the incision alone.   Remove dressing in 48 hours   Complete by: As directed       Allergies as of 01/29/2021   No Known Allergies      Medication List     STOP taking these medications    acetaminophen 500 MG tablet Commonly known as: TYLENOL   celecoxib 100 MG capsule Commonly known as: CELEBREX       TAKE these medications    amitriptyline 50 MG tablet Commonly known as: ELAVIL Take 50 mg by mouth at bedtime as needed (pain).   aspirin EC 81 MG tablet Take 1 tablet (81 mg total) by mouth daily.   blood glucose meter kit and supplies Kit Dispense based on patient and insurance preference.   busPIRone 5 MG tablet Commonly known as: BUSPAR Take 1 tablet (5 mg  total) by mouth 3 (three) times daily.   cetirizine 10 MG tablet Commonly known as: ZYRTEC Take 1 tablet (10 mg total) by mouth daily.   famotidine 20 MG tablet Commonly known as: PEPCID Take 20 mg by mouth 2 (two) times daily.   gabapentin 400 MG capsule Commonly known as: NEURONTIN TAKE (1) CAPSULE BY MOUTH FOUR TIMES DAILY. What changed: See the new instructions.   hydrochlorothiazide 25 MG tablet Commonly known as: HYDRODIURIL Take 25 mg by mouth daily.   HYDROcodone-acetaminophen 5-325 MG tablet Commonly known as: NORCO/VICODIN Take 2 tablets by mouth every 4 (four) hours as needed for severe pain ((score 7 to 10)).   losartan 100 MG tablet Commonly known as: COZAAR Take 100 mg by  mouth daily.   metFORMIN 500 MG tablet Commonly known as: GLUCOPHAGE Take 1 tablet (500 mg total) by mouth 2 (two) times daily with a meal.   Mucinex DM 30-600 MG Tb12 Take 1 tablet by mouth every 12 (twelve) hours.   Naphcon-A 0.025-0.3 % ophthalmic solution Generic drug: naphazoline-pheniramine Place 1-2 drops into both eyes in the morning, at noon, in the evening, and at bedtime.   simvastatin 40 MG tablet Commonly known as: ZOCOR Take 1 tablet (40 mg total) by mouth daily.   trolamine salicylate 10 % cream Commonly known as: ASPERCREME Apply 1 application topically as needed for muscle pain.   venlafaxine XR 75 MG 24 hr capsule Commonly known as: EFFEXOR-XR Take 1 capsule (75 mg total) by mouth daily with breakfast.         Signed: Ophelia Charter 01/29/2021, 9:16 AM

## 2021-01-29 NOTE — Care Management (Signed)
°  Transition of Care [facility]) Screening Note   Patient Details  Name: Aloria Delucchi Date of Birth: 02-Jul-1955   Transition of Care [facility]) CM/SW Contact:    Lawerance Sabal, RN Phone Number: 01/29/2021, 9:27 AM    Transition of Care Department [facility]) has reviewed patient and no TOC needs have been identified at this time. DME needs will be fulfilled by unit staff

## 2021-01-29 NOTE — Progress Notes (Signed)
PT Cancellation Note  Patient Details Name: Leah King MRN: [id number] DOB: 1955/08/17   Cancelled Treatment:    Reason Eval/Treat Not Completed: PT screened, no needs identified, will sign off OT evaluated and reports no PT needs.  Pt for DC today.    [name] 01/29/2021, 10:11 AM Lavona Mound, PT   Acute Rehabilitation Services  Pager [phone] Office [phone] 01/29/2021

## 2021-01-29 NOTE — Evaluation (Signed)
Occupational Therapy Evaluation Patient Details Name: Leah King MRN: [id number] DOB: 02-12-1955 Today's Date: 01/29/2021   History of Present Illness 66 y.o. F admitted for C3-4 and C4-5 anterior cervical discectomy, fusion and plating. PMH significant for HTN, HLD, GERD, Arthritis, Anxiety/Depression.   Clinical Impression   Pt admitted for concerns listed above. PTA Pt reported that she was independent with all ADL's and IADL's, including cooking, cleaning, and grocery shopping. She has a good support system at home to assist with any needs she may have. At this time, pt able to complete basic ADL's with no difficulties, as well as functional mobility with no concerns. Pt was educated on all cervical precautions as well as compensatory strategies to maximize her independence and safety. She has no further OT needs at this time and acute OT will sign off.       Recommendations for follow up therapy are one component of a multi-disciplinary discharge planning process, led by the attending physician.  Recommendations may be updated based on patient status, additional functional criteria and insurance authorization.   Follow Up Recommendations  No OT follow up    Assistance Recommended at Discharge PRN  Patient can return home with the following      Functional Status Assessment  Patient has had a recent decline in their functional status and demonstrates the ability to make significant improvements in function in a reasonable and predictable amount of time.  Equipment Recommendations  None recommended by OT    Recommendations for Other Services       Precautions / Restrictions Precautions Precautions: Cervical Precaution Booklet Issued: Yes (comment) Precaution Comments: Reviewed all precautions and compensatory strategies. Required Braces or Orthoses: Cervical Brace Cervical Brace: Soft collar;For comfort Restrictions Weight Bearing Restrictions: No      Mobility Bed  Mobility Overal bed mobility: Modified Independent             General bed mobility comments: Completed log roll with no assist    Transfers Overall transfer level: Modified independent Equipment used: None               General transfer comment: Completed multiple sit<>stands from bed, toilet, and rollator, all independently.      Balance Overall balance assessment: No apparent balance deficits (not formally assessed)                                         ADL either performed or assessed with clinical judgement   ADL Overall ADL's : Modified independent                                       General ADL Comments: Pt able to complete all ADL's and functional mobility with no assist, following cervical precautions and compensatory strategies.     Vision Baseline Vision/History: 1 Wears glasses Ability to See in Adequate Light: 0 Adequate Patient Visual Report: No change from baseline Vision Assessment?: No apparent visual deficits     Perception     Praxis      Pertinent Vitals/Pain Pain Assessment: No/denies pain     Hand Dominance Right   Extremity/Trunk Assessment Upper Extremity Assessment Upper Extremity Assessment: Overall WFL for tasks assessed   Lower Extremity Assessment Lower Extremity Assessment: Overall WFL for tasks assessed   Cervical / Trunk Assessment  Cervical / Trunk Assessment: Neck Surgery   Communication Communication Communication: No difficulties   Cognition Arousal/Alertness: Awake/alert Behavior During Therapy: WFL for tasks assessed/performed Overall Cognitive Status: Within Functional Limits for tasks assessed                                       General Comments  VSS on RA, honeycomb dressing appears dry and intact    Exercises     Shoulder Instructions      Home Living Family/patient expects to be discharged to:: Private residence Living Arrangements: Other  relatives (Leah King) Available Help at Discharge: Family;Available 24 hours/day Type of Home: Apartment Home Access: Level entry     Home Layout: One level     Bathroom Shower/Tub: Teacher, early years/pre: Standard Bathroom Accessibility: Yes How Accessible: Accessible via walker Home Equipment: Rollator (4 wheels);Cane - single point;Shower seat;Grab bars - toilet;Grab bars - tub/shower          Prior Functioning/Environment Prior Level of Function : Independent/Modified Independent                        OT Problem List: Decreased strength;Impaired balance (sitting and/or standing);Pain      OT Treatment/Interventions:      OT Goals(Current goals can be found in the care plan section) Acute Rehab OT Goals Patient Stated Goal: To go home OT Goal Formulation: All assessment and education complete, DC therapy Time For Goal Achievement: 01/29/21 Potential to Achieve Goals: Good  OT Frequency:      Co-evaluation              AM-PAC OT "6 Clicks" Daily Activity     Outcome Measure Help from another person eating meals?: None Help from another person taking care of personal grooming?: None Help from another person toileting, which includes using toliet, bedpan, or urinal?: None Help from another person bathing (including washing, rinsing, drying)?: None Help from another person to put on and taking off regular upper body clothing?: None Help from another person to put on and taking off regular lower body clothing?: None 6 Click Score: 24   End of Session Equipment Utilized During Treatment: Cervical collar Nurse Communication: Mobility status  Activity Tolerance: Patient tolerated treatment well Patient left: in bed;with call bell/phone within reach;with family/visitor present  OT Visit Diagnosis: Other abnormalities of gait and mobility (R26.89);Muscle weakness (generalized) (M62.81)                Time: [id number] OT Time Calculation  (min): 31 min Charges:  OT General Charges $OT Visit: 1 Visit OT Evaluation $OT Eval Moderate Complexity: 1 Mod OT Treatments $Self Care/Home Management : 8-22 mins  Leah King H., OTR/L Acute Rehabilitation  Leah King Leah King 01/29/2021, 9:52 AM

## 2021-01-30 ENCOUNTER — Encounter (HOSPITAL_COMMUNITY): Payer: Self-pay | Admitting: Neurosurgery

## 2021-02-10 ENCOUNTER — Other Ambulatory Visit: Payer: Self-pay | Admitting: Neurosurgery

## 2021-02-10 ENCOUNTER — Other Ambulatory Visit (HOSPITAL_COMMUNITY): Payer: Self-pay | Admitting: Neurosurgery

## 2021-02-10 DIAGNOSIS — M48062 Spinal stenosis, lumbar region with neurogenic claudication: Secondary | ICD-10-CM

## 2021-02-21 ENCOUNTER — Encounter (HOSPITAL_COMMUNITY): Payer: Self-pay | Admitting: Neurosurgery

## 2021-02-24 ENCOUNTER — Ambulatory Visit (HOSPITAL_COMMUNITY)
Admission: RE | Admit: 2021-02-24 | Discharge: 2021-02-24 | Disposition: A | Discharge status: Home / Self Care | Payer: Medicare Other | Source: Ambulatory Visit | Attending: Neurosurgery | Admitting: Neurosurgery

## 2021-02-24 ENCOUNTER — Other Ambulatory Visit: Payer: Self-pay

## 2021-02-24 DIAGNOSIS — M48062 Spinal stenosis, lumbar region with neurogenic claudication: Secondary | ICD-10-CM | POA: Insufficient documentation

## 2021-08-18 ENCOUNTER — Other Ambulatory Visit: Payer: Self-pay | Admitting: Neurosurgery

## 2021-09-09 ENCOUNTER — Other Ambulatory Visit: Payer: Self-pay

## 2021-09-09 NOTE — Progress Notes (Signed)
PCP - Karl Luke, MD Cardiologist - denies  PPM/ICD - denies  Chest x-ray - n/a EKG - 01/26/21 Stress Test - denies ECHO - denies Cardiac Cath - denies  CPAP - denies  Fasting Blood Sugar - patient is checking CBG at home randomly A1C - 6.8 on 01/26/21  Blood Thinner Instructions: n/a Aspirin Instructions: Aspirin - last dose - 09/07/21 per patient Patient was instructed: As of today, STOP taking any Aspirin (unless otherwise instructed by your surgeon) Aleve, Naproxen, Ibuprofen, Motrin, Advil, Goody's, BC's, all herbal medications, fish oil, and all vitamins.  ERAS Protcol - n/a  COVID TEST- n/a  Anesthesia review: no  Patient verbally denies any shortness of breath, fever, cough and chest pain during phone call   -------------  SDW INSTRUCTIONS given:  Your procedure is scheduled on Monday, August 21st, 2023.  Report to [facility] Main Entrance "A" at 08:45 A.M., and check in at the Admitting office.  Call this number if you have problems the morning of surgery:  [phone]   Remember:  Do not eat or drink after midnight the night before your surgery    Take these medicines the morning of surgery with A SIP OF WATER Buspar, Zyrtec, Mucinex, Pepcid, Neurontin, Zocor, Effexor, eye drops PRN: Norco   The day of surgery:                     Do not wear jewelry, make up, or nail polish            Do not wear lotions, powders, perfumes, or deodorant.            Do not shave 48 hours prior to surgery.              Do not bring valuables to the hospital.            [facility] is not responsible for any belongings or valuables.  Do NOT Smoke (Tobacco/Vaping) 24 hours prior to your procedure If you use a CPAP at night, you may bring all equipment for your overnight stay.   Contacts, glasses, dentures or bridgework may not be worn into surgery.      For patients admitted to the hospital, discharge time will be determined by your treatment team.    Patients discharged the day of surgery will not be allowed to drive home, and someone needs to stay with them for 24 hours.    Special instructions:   [facility]- Preparing For Surgery  Before surgery, you can play an important role. Because skin is not sterile, your skin needs to be as free of germs as possible. You can reduce the number of germs on your skin by washing with CHG (chlorahexidine gluconate) Soap before surgery.  CHG is an antiseptic cleaner which kills germs and bonds with the skin to continue killing germs even after washing.    Oral Hygiene is also important to reduce your risk of infection.  Remember - BRUSH YOUR TEETH THE MORNING OF SURGERY WITH YOUR REGULAR TOOTHPASTE  Please do not use if you have an allergy to CHG or antibacterial soaps. If your skin becomes reddened/irritated stop using the CHG.  Do not shave (including legs and underarms) for at least 48 hours prior to first CHG shower. It is OK to shave your face.  Please follow these instructions carefully.   Shower the NIGHT BEFORE SURGERY and the MORNING OF SURGERY with DIAL Soap.   Pat yourself dry with  a CLEAN TOWEL.  Wear CLEAN PAJAMAS to bed the night before surgery  Place CLEAN SHEETS on your bed the night of your first shower and DO NOT SLEEP WITH PETS.   Day of Surgery: Please shower morning of surgery  Wear Clean/Comfortable clothing the morning of surgery Do not apply any deodorants/lotions.   Remember to brush your teeth WITH YOUR REGULAR TOOTHPASTE.   Questions were answered. Patient verbalized understanding of instructions.

## 2021-09-12 ENCOUNTER — Encounter (HOSPITAL_COMMUNITY)
Admission: RE | Disposition: A | Discharge status: Home / Self Care | Payer: Self-pay | Source: Home / Self Care | Attending: Neurosurgery

## 2021-09-12 ENCOUNTER — Other Ambulatory Visit: Payer: Self-pay

## 2021-09-12 ENCOUNTER — Ambulatory Visit (HOSPITAL_BASED_OUTPATIENT_CLINIC_OR_DEPARTMENT_OTHER): Payer: Medicare Other | Admitting: Certified Registered Nurse Anesthetist

## 2021-09-12 ENCOUNTER — Encounter (HOSPITAL_COMMUNITY): Payer: Self-pay | Admitting: Neurosurgery

## 2021-09-12 ENCOUNTER — Ambulatory Visit (HOSPITAL_COMMUNITY)
Admission: RE | Admit: 2021-09-12 | Discharge: 2021-09-12 | Disposition: A | Discharge status: Home / Self Care | Payer: Medicare Other | Attending: Neurosurgery | Admitting: Neurosurgery

## 2021-09-12 ENCOUNTER — Ambulatory Visit (HOSPITAL_COMMUNITY): Payer: Medicare Other | Admitting: Certified Registered Nurse Anesthetist

## 2021-09-12 DIAGNOSIS — F17210 Nicotine dependence, cigarettes, uncomplicated: Secondary | ICD-10-CM | POA: Diagnosis not present

## 2021-09-12 DIAGNOSIS — E119 Type 2 diabetes mellitus without complications: Secondary | ICD-10-CM

## 2021-09-12 DIAGNOSIS — I10 Essential (primary) hypertension: Secondary | ICD-10-CM | POA: Insufficient documentation

## 2021-09-12 DIAGNOSIS — G5602 Carpal tunnel syndrome, left upper limb: Secondary | ICD-10-CM | POA: Diagnosis present

## 2021-09-12 DIAGNOSIS — F17200 Nicotine dependence, unspecified, uncomplicated: Secondary | ICD-10-CM | POA: Diagnosis not present

## 2021-09-12 DIAGNOSIS — K219 Gastro-esophageal reflux disease without esophagitis: Secondary | ICD-10-CM | POA: Insufficient documentation

## 2021-09-12 HISTORY — PX: CARPAL TUNNEL RELEASE: SHX101

## 2021-09-12 LAB — BASIC METABOLIC PANEL
Anion gap: 9 (ref 5–15)
BUN: 21 mg/dL (ref 8–23)
CO2: 24 mmol/L (ref 22–32)
Calcium: 10.9 mg/dL — ABNORMAL HIGH (ref 8.9–10.3)
Chloride: 103 mmol/L (ref 98–111)
Creatinine, Ser: 1.15 mg/dL — ABNORMAL HIGH (ref 0.44–1.00)
GFR, Estimated: 52 mL/min — ABNORMAL LOW (ref >60)
Glucose, Bld: 104 mg/dL — ABNORMAL HIGH (ref 70–99)
Potassium: 4.2 mmol/L (ref 3.5–5.1)
Sodium: 136 mmol/L (ref 135–145)

## 2021-09-12 LAB — CBC
HCT: 42 % (ref 36.0–46.0)
Hemoglobin: 13.8 g/dL (ref 12.0–15.0)
MCH: 32.5 pg (ref 26.0–34.0)
MCHC: 32.9 g/dL (ref 30.0–36.0)
MCV: 99.1 fL (ref 80.0–100.0)
Platelets: 224 10*3/uL (ref 150–400)
RBC: 4.24 MIL/uL (ref 3.87–5.11)
RDW: 11.9 % (ref 11.5–15.5)
WBC: 6 10*3/uL (ref 4.0–10.5)
nRBC: 0 % (ref 0.0–0.2)

## 2021-09-12 LAB — GLUCOSE, CAPILLARY
Glucose-Capillary: 97 mg/dL (ref 70–99)
Glucose-Capillary: 98 mg/dL (ref 70–99)

## 2021-09-12 SURGERY — CARPAL TUNNEL RELEASE
Anesthesia: Monitor Anesthesia Care | Laterality: Left

## 2021-09-12 MED ORDER — HYDROCODONE-ACETAMINOPHEN 5-325 MG PO TABS
1.0000 | ORAL_TABLET | ORAL | 0 refills | Status: AC | PRN
Start: 2021-09-12 — End: 2022-09-12

## 2021-09-12 MED ORDER — LIDOCAINE 2% (20 MG/ML) 5 ML SYRINGE
INTRAMUSCULAR | Status: AC
  Filled 2021-09-12: qty 5

## 2021-09-12 MED ORDER — LACTATED RINGERS IV SOLN: INTRAVENOUS | Status: DC

## 2021-09-12 MED ORDER — ONDANSETRON HCL 4 MG/2ML IJ SOLN
INTRAMUSCULAR | Status: AC
  Filled 2021-09-12: qty 2

## 2021-09-12 MED ORDER — MIDAZOLAM HCL 2 MG/2ML IJ SOLN
INTRAMUSCULAR | Status: AC
  Filled 2021-09-12: qty 2

## 2021-09-12 MED ORDER — DEXAMETHASONE SODIUM PHOSPHATE 10 MG/ML IJ SOLN
INTRAMUSCULAR | Status: DC | PRN
  Administered 2021-09-12: 10 mg via INTRAVENOUS

## 2021-09-12 MED ORDER — EPHEDRINE 5 MG/ML INJ
INTRAVENOUS | Status: AC
  Filled 2021-09-12: qty 5

## 2021-09-12 MED ORDER — LIDOCAINE-EPINEPHRINE 1 %-1:100000 IJ SOLN
INTRAMUSCULAR | Status: AC
  Filled 2021-09-12: qty 1

## 2021-09-12 MED ORDER — EPHEDRINE SULFATE-NACL 50-0.9 MG/10ML-% IV SOSY
PREFILLED_SYRINGE | INTRAVENOUS | Status: DC | PRN
  Administered 2021-09-12 (×3): 5 mg via INTRAVENOUS

## 2021-09-12 MED ORDER — CHLORHEXIDINE GLUCONATE 0.12 % MT SOLN
15.0000 mL | Freq: Once | OROMUCOSAL | Status: AC
  Administered 2021-09-12: 15 mL via OROMUCOSAL
  Filled 2021-09-12: qty 15

## 2021-09-12 MED ORDER — ONDANSETRON HCL 4 MG/2ML IJ SOLN
INTRAMUSCULAR | Status: DC | PRN
  Administered 2021-09-12: 4 mg via INTRAVENOUS

## 2021-09-12 MED ORDER — FENTANYL CITRATE (PF) 250 MCG/5ML IJ SOLN
INTRAMUSCULAR | Status: AC
  Filled 2021-09-12: qty 5

## 2021-09-12 MED ORDER — MIDAZOLAM HCL 2 MG/2ML IJ SOLN
INTRAMUSCULAR | Status: DC | PRN
  Administered 2021-09-12: 1 mg via INTRAVENOUS

## 2021-09-12 MED ORDER — PROPOFOL 10 MG/ML IV BOLUS
INTRAVENOUS | Status: DC | PRN
  Administered 2021-09-12: 150 mg via INTRAVENOUS

## 2021-09-12 MED ORDER — CHLORHEXIDINE GLUCONATE CLOTH 2 % EX PADS: 6.0000 | MEDICATED_PAD | Freq: Once | CUTANEOUS | Status: DC

## 2021-09-12 MED ORDER — LIDOCAINE-EPINEPHRINE 1 %-1:100000 IJ SOLN
INTRAMUSCULAR | Status: DC | PRN
  Administered 2021-09-12: 6 mL

## 2021-09-12 MED ORDER — PHENYLEPHRINE 80 MCG/ML (10ML) SYRINGE FOR IV PUSH (FOR BLOOD PRESSURE SUPPORT)
PREFILLED_SYRINGE | INTRAVENOUS | Status: DC | PRN
  Administered 2021-09-12 (×2): 80 ug via INTRAVENOUS

## 2021-09-12 MED ORDER — LIDOCAINE 2% (20 MG/ML) 5 ML SYRINGE
INTRAMUSCULAR | Status: DC | PRN
  Administered 2021-09-12: 100 mg via INTRAVENOUS

## 2021-09-12 MED ORDER — ORAL CARE MOUTH RINSE: 15.0000 mL | Freq: Once | OROMUCOSAL | Status: AC

## 2021-09-12 MED ORDER — CEFAZOLIN SODIUM-DEXTROSE 2-4 GM/100ML-% IV SOLN
2.0000 g | INTRAVENOUS | Status: AC
  Administered 2021-09-12: 2 g via INTRAVENOUS
  Filled 2021-09-12: qty 100

## 2021-09-12 MED ORDER — PROPOFOL 10 MG/ML IV BOLUS
INTRAVENOUS | Status: AC
  Filled 2021-09-12: qty 20

## 2021-09-12 MED ORDER — DEXAMETHASONE SODIUM PHOSPHATE 10 MG/ML IJ SOLN
INTRAMUSCULAR | Status: AC
  Filled 2021-09-12: qty 1

## 2021-09-12 SURGICAL SUPPLY — 60 items
BAG COUNTER SPONGE SURGICOUNT (BAG) ×1 IMPLANT
BANDAGE GAUZE ELAST BULKY 4 IN (GAUZE/BANDAGES/DRESSINGS) IMPLANT
BLADE SURG 15 STRL LF DISP TIS (BLADE) ×1 IMPLANT
BLADE SURG 15 STRL SS (BLADE) ×1
BNDG ELASTIC 3X5.8 VLCR STR LF (GAUZE/BANDAGES/DRESSINGS) ×1 IMPLANT
BNDG ELASTIC 4X5.8 VLCR STR LF (GAUZE/BANDAGES/DRESSINGS) IMPLANT
BNDG GAUZE ELAST 4 BULKY (GAUZE/BANDAGES/DRESSINGS) ×1 IMPLANT
CABLE BIPOLOR RESECTION CORD (MISCELLANEOUS) ×1 IMPLANT
CANISTER SUCT 3000ML PPV (MISCELLANEOUS) ×1 IMPLANT
CARTRIDGE OIL MAESTRO DRILL (MISCELLANEOUS) ×1 IMPLANT
DIFFUSER DRILL AIR PNEUMATIC (MISCELLANEOUS) ×1 IMPLANT
DRAPE EXTREMITY T 121X128X90 (DISPOSABLE) ×1 IMPLANT
DRAPE HALF SHEET 40X57 (DRAPES) ×1 IMPLANT
DRSG EMULSION OIL 3X3 NADH (GAUZE/BANDAGES/DRESSINGS) IMPLANT
GAUZE 4X4 16PLY ~~LOC~~+RFID DBL (SPONGE) ×1 IMPLANT
GAUZE SPONGE 4X4 12PLY STRL (GAUZE/BANDAGES/DRESSINGS) ×1 IMPLANT
GLOVE BIO SURGEON STRL SZ 6.5 (GLOVE) IMPLANT
GLOVE BIO SURGEON STRL SZ7 (GLOVE) IMPLANT
GLOVE BIO SURGEON STRL SZ7.5 (GLOVE) IMPLANT
GLOVE BIO SURGEON STRL SZ8 (GLOVE) ×1 IMPLANT
GLOVE BIO SURGEON STRL SZ8.5 (GLOVE) IMPLANT
GLOVE BIOGEL M 8.0 STRL (GLOVE) IMPLANT
GLOVE BIOGEL PI IND STRL 7.0 (GLOVE) IMPLANT
GLOVE BIOGEL PI INDICATOR 7.0 (GLOVE)
GLOVE ECLIPSE 6.5 STRL STRAW (GLOVE) IMPLANT
GLOVE ECLIPSE 7.0 STRL STRAW (GLOVE) IMPLANT
GLOVE ECLIPSE 7.5 STRL STRAW (GLOVE) IMPLANT
GLOVE ECLIPSE 8.0 STRL XLNG CF (GLOVE) IMPLANT
GLOVE ECLIPSE 8.5 STRL (GLOVE) IMPLANT
GLOVE EXAM NITRILE XL STR (GLOVE) IMPLANT
GLOVE INDICATOR 6.5 STRL GRN (GLOVE) IMPLANT
GLOVE INDICATOR 7.0 STRL GRN (GLOVE) IMPLANT
GLOVE INDICATOR 7.5 STRL GRN (GLOVE) IMPLANT
GLOVE INDICATOR 8.0 STRL GRN (GLOVE) IMPLANT
GLOVE INDICATOR 8.5 STRL (GLOVE) ×2 IMPLANT
GLOVE OPTIFIT SS 8.0 STRL (GLOVE) IMPLANT
GLOVE SURG SS PI 6.5 STRL IVOR (GLOVE) IMPLANT
GOWN STRL REUS W/ TWL LRG LVL3 (GOWN DISPOSABLE) ×1 IMPLANT
GOWN STRL REUS W/ TWL XL LVL3 (GOWN DISPOSABLE) ×1 IMPLANT
GOWN STRL REUS W/TWL 2XL LVL3 (GOWN DISPOSABLE) ×1 IMPLANT
GOWN STRL REUS W/TWL LRG LVL3 (GOWN DISPOSABLE) ×2
GOWN STRL REUS W/TWL XL LVL3 (GOWN DISPOSABLE) ×1
HAND ALUMI LG (SOFTGOODS) ×1 IMPLANT
KIT BASIN OR (CUSTOM PROCEDURE TRAY) ×1 IMPLANT
KIT TURNOVER KIT B (KITS) ×1 IMPLANT
NDL HYPO 25X1 1.5 SAFETY (NEEDLE) IMPLANT
NEEDLE HYPO 25X1 1.5 SAFETY (NEEDLE) ×1 IMPLANT
NS IRRIG 1000ML POUR BTL (IV SOLUTION) ×1 IMPLANT
OIL CARTRIDGE MAESTRO DRILL (MISCELLANEOUS)
PACK SURGICAL SETUP 50X90 (CUSTOM PROCEDURE TRAY) ×1 IMPLANT
PAD ARMBOARD 7.5X6 YLW CONV (MISCELLANEOUS) ×2 IMPLANT
SPIKE FLUID TRANSFER (MISCELLANEOUS) ×1 IMPLANT
STOCKINETTE 4X48 STRL (DRAPES) ×1 IMPLANT
SUT ETHILON 3 0 PS 1 (SUTURE) ×1 IMPLANT
SYR BULB EAR ULCER 3OZ GRN STR (SYRINGE) ×1 IMPLANT
SYR CONTROL 10ML LL (SYRINGE) IMPLANT
TOWEL GREEN STERILE (TOWEL DISPOSABLE) ×1 IMPLANT
TOWEL GREEN STERILE FF (TOWEL DISPOSABLE) ×1 IMPLANT
UNDERPAD 30X36 HEAVY ABSORB (UNDERPADS AND DIAPERS) ×1 IMPLANT
WATER STERILE IRR 1000ML POUR (IV SOLUTION) ×1 IMPLANT

## 2021-09-12 NOTE — Transfer of Care (Signed)
Immediate Anesthesia Transfer of Care Note  Patient: Jayliana Myrick  Procedure(s) Performed: Left Carpal tunnel release (Left)  Patient Location: PACU  Anesthesia Type:General  Level of Consciousness: awake and drowsy  Airway & Oxygen Therapy: Patient Spontanous Breathing  Post-op Assessment: Report given to RN, Post -op Vital signs reviewed and stable and Patient moving all extremities X 4  Post vital signs: Reviewed and stable  Last Vitals:  Vitals Value Taken Time  BP 129/55 09/12/21 1206  Temp    Pulse 62 09/12/21 1208  Resp 12 09/12/21 1208  SpO2 100 % 09/12/21 1208  Vitals shown include unvalidated device data.  Last Pain:  Vitals:   09/12/21 0933  TempSrc:   PainSc: 0-No pain         Complications: No notable events documented.

## 2021-09-12 NOTE — Anesthesia Postprocedure Evaluation (Signed)
Anesthesia Post Note  Patient: Leah King  Procedure(s) Performed: Left Carpal tunnel release (Left)     Patient location during evaluation: PACU Anesthesia Type: MAC and General Level of consciousness: awake Pain management: pain level controlled Vital Signs Assessment: post-procedure vital signs reviewed and stable Respiratory status: spontaneous breathing Cardiovascular status: stable Postop Assessment: no apparent nausea or vomiting Anesthetic complications: no   No notable events documented.  Last Vitals:  Vitals:   09/12/21 1230 09/12/21 1235  BP:  114/65  Pulse: 66 66  Resp: (!) 21 17  Temp:  36.5 C  SpO2: 94% 94%    Last Pain:  Vitals:   09/12/21 1235  TempSrc:   PainSc: 0-No pain                 Khary Schaben

## 2021-09-12 NOTE — Anesthesia Procedure Notes (Addendum)
Procedure Name: LMA Insertion Date/Time: 09/12/2021 11:19 AM  Performed by: Nils Pyle, CRNAPre-anesthesia Checklist: Patient identified, Emergency Drugs available, Suction available and Patient being monitored Patient Re-evaluated:Patient Re-evaluated prior to induction Oxygen Delivery Method: Circle System Utilized Preoxygenation: Pre-oxygenation with 100% oxygen Induction Type: IV induction LMA: LMA inserted LMA Size: 4.0 Number of attempts: 1 Placement Confirmation: positive ETCO2 and breath sounds checked- equal and bilateral Tube secured with: Tape Dental Injury: Teeth and Oropharynx as per pre-operative assessment

## 2021-09-12 NOTE — H&P (Signed)
Leah King is an 66 y.o. female.   Chief Complaint: Left carpal tunnel syndrome HPI: 66 year old female with carpal tunnel syndrome bilaterally previously had a right side fixed presents now for left carpal tunnel release.  She reports numbness tingling first 3 fingers EMG showed severe median nerve entrapment at the wrist.  So I extensively over the risks and benefits of a left-sided carpal tunnel release with her as well as perioperative course expectations of outcome and alternatives of surgery and she understood and agreed to proceed forward.  Past Medical History:  Diagnosis Date   Anxiety    Arthritis    back and legs   Depression    Diabetes mellitus without complication (HCC)    GERD (gastroesophageal reflux disease)    Hyperlipidemia    Hypertension     Past Surgical History:  Procedure Laterality Date   ANTERIOR CERVICAL DECOMP/DISCECTOMY FUSION N/A 01/28/2021   Procedure: ACDF - C3-C4 - C4-C5;  Surgeon: Donalee Citrin, MD;  Location: [facility] OR;  Service: Neurosurgery;  Laterality: N/A;  3C   SKIN GRAFT FULL THICKNESS ARM      Family History  Problem Relation Age of Onset   Alcohol abuse Mother    Heart disease Father    Hypertension Son    Hypertension Daughter    Diabetes Daughter    Alzheimer's disease Maternal Grandmother    Diabetes Maternal Grandfather    Social History:  reports that she has been smoking cigarettes. She started smoking about 31 years ago. She has been smoking an average of .25 packs per day. She has never used smokeless tobacco. She reports that she does not drink alcohol and does not use drugs.  Allergies: No Known Allergies  Medications Prior to Admission  Medication Sig Dispense Refill   acetaminophen (TYLENOL) 500 MG tablet Take 1,000 mg by mouth daily as needed for mild pain or headache.     amitriptyline (ELAVIL) 50 MG tablet Take 50 mg by mouth at bedtime as needed for sleep.     busPIRone (BUSPAR) 5 MG tablet Take 1 tablet (5 mg total) by  mouth 3 (three) times daily. 90 tablet 11   celecoxib (CELEBREX) 100 MG capsule Take 100 mg by mouth daily.     cetirizine (ZYRTEC) 10 MG tablet Take 1 tablet (10 mg total) by mouth daily. 30 tablet 11   famotidine (PEPCID) 20 MG tablet Take 20 mg by mouth 2 (two) times daily.     gabapentin (NEURONTIN) 400 MG capsule TAKE (1) CAPSULE BY MOUTH FOUR TIMES DAILY. (Patient taking differently: Take 400 mg by mouth 3 (three) times daily.) 112 capsule 11   GOODSENSE ARTHRITIS PAIN 1 % GEL Apply 1 Application topically daily as needed (pain).     hydrochlorothiazide (HYDRODIURIL) 25 MG tablet Take 25 mg by mouth daily.     losartan (COZAAR) 100 MG tablet Take 100 mg by mouth daily.     metFORMIN (GLUCOPHAGE) 500 MG tablet Take 1 tablet (500 mg total) by mouth 2 (two) times daily with a meal. 60 tablet 11   simvastatin (ZOCOR) 40 MG tablet Take 1 tablet (40 mg total) by mouth daily. 30 tablet 11   trolamine salicylate (ASPERCREME) 10 % cream Apply 1 application topically as needed for muscle pain.     venlafaxine XR (EFFEXOR-XR) 75 MG 24 hr capsule Take 1 capsule (75 mg total) by mouth daily with breakfast. 30 capsule 11   aspirin EC 81 MG tablet Take 1 tablet (81 mg total) by  mouth daily. (Patient not taking: Reported on 09/12/2021) 30 tablet 11   HYDROcodone-acetaminophen (NORCO/VICODIN) 5-325 MG tablet Take 2 tablets by mouth every 4 (four) hours as needed for severe pain ((score 7 to 10)). (Patient not taking: Reported on 09/12/2021) 30 tablet 0    Results for orders placed or performed during the hospital encounter of 09/12/21 (from the past 48 hour(s))  Glucose, capillary     Status: None   Collection Time: 09/12/21  9:01 AM  Result Value Ref Range   Glucose-Capillary 97 70 - 99 mg/dL    Comment: Glucose reference range applies only to samples taken after fasting for at least 8 hours.  CBC per protocol     Status: None   Collection Time: 09/12/21  9:35 AM  Result Value Ref Range   WBC 6.0 4.0 -  10.5 K/uL   RBC 4.24 3.87 - 5.11 MIL/uL   Hemoglobin 13.8 12.0 - 15.0 g/dL   HCT 23.7 62.8 - 31.5 %   MCV 99.1 80.0 - 100.0 fL   MCH 32.5 26.0 - 34.0 pg   MCHC 32.9 30.0 - 36.0 g/dL   RDW 17.6 16.0 - 73.7 %   Platelets 224 150 - 400 K/uL   nRBC 0.0 0.0 - 0.2 %    Comment: Performed at [facility], [address] [place], [place] [address]   No results found.  Review of Systems  Neurological:  Positive for weakness and numbness.    Blood pressure 130/80, pulse 67, temperature 98.1 F (36.7 C), temperature source Oral, resp. rate 17, height 5\' 3"  (1.6 m), weight 99.6 kg, SpO2 96 %. Physical Exam HENT:     Head: Normocephalic.     Right Ear: Tympanic membrane normal.     Nose: Nose normal.     Mouth/Throat:     Mouth: Mucous membranes are moist.  Eyes:     Pupils: Pupils are equal, round, and reactive to light.  Cardiovascular:     Rate and Rhythm: Normal rate.  Pulmonary:     Effort: Pulmonary effort is normal.  Abdominal:     General: Abdomen is flat.  Musculoskeletal:        General: Normal range of motion.     Cervical back: Normal range of motion.  Skin:    General: Skin is warm.  Neurological:     General: No focal deficit present.     Mental Status: She is alert.     Comments: Patient is awake and alert strength is 5 out of 5 upper and lower extremities bilaterally.  Slight weakness in her left hand positive Phalen's and Tinel's test      Assessment/Plan 58-year female presents for left carpal tunnel release  , MD 09/12/2021, 10:31 AM

## 2021-09-12 NOTE — Anesthesia Preprocedure Evaluation (Signed)
Anesthesia Evaluation  Patient identified by MRN, date of birth, ID band Patient awake    Reviewed: Allergy & Precautions, NPO status , Patient's Chart, lab work & pertinent test results  Airway Mallampati: II  TM Distance: >3 FB     Dental   Pulmonary Current Smoker,    breath sounds clear to auscultation       Cardiovascular hypertension,  Rhythm:Regular Rate:Normal     Neuro/Psych PSYCHIATRIC DISORDERS    GI/Hepatic Neg liver ROS, GERD  ,  Endo/Other  diabetes  Renal/GU negative Renal ROS     Musculoskeletal   Abdominal   Peds  Hematology   Anesthesia Other Findings   Reproductive/Obstetrics                             Anesthesia Physical Anesthesia Plan  ASA: 3  Anesthesia Plan: MAC   Post-op Pain Management:    Induction: Intravenous  PONV Risk Score and Plan: 2 and Ondansetron, Dexamethasone and Midazolam  Airway Management Planned: Simple Face Mask and Nasal Cannula  Additional Equipment:   Intra-op Plan:   Post-operative Plan:   Informed Consent: I have reviewed the patients History and Physical, chart, labs and discussed the procedure including the risks, benefits and alternatives for the proposed anesthesia with the patient or authorized representative who has indicated his/her understanding and acceptance.     Dental advisory given  Plan Discussed with: CRNA and Anesthesiologist  Anesthesia Plan Comments:         Anesthesia Quick Evaluation

## 2021-09-12 NOTE — Op Note (Signed)
  preoperative diagnosis: Left carpal tunnel syndrome.  Postoperative diagnosis: Same  Procedure: Left carpal tunnel release.  Surgeon: Donalee Citrin.  Assistant Julien Girt  Anesthesia: General LMA  EBL: Minimal  HPI: 66 year old female numbness tingling bilaterally EMG documented severe median nerve entrapment at the wrist of both hands patient already had the right hand release presents for left carpal tunnel release.  I extensively went over the risks and benefits of the operation with the patient as well as perioperative course expectations of outcome and alternatives of surgery and she understood and agreed to proceed forward.  Operative procedure: Patient was brought into the OR was induced under general anesthesia in the left arm and hand was prepped and draped in routine sterile fashion.  The incision was drawn off in the distal crease of the wrist along the palmar threes about 4 cm long infiltrated with 6 cc lidocaine with epi and then incised with a 15 blade scalpel.  The subcutaneous tissue was dissected free until we identified the transverse carpal ligament this was then divided identify the epidural of the median nerve then the plane between the transverse carpal ligament the median nerve was developed with a pair hemostats and the and the ligament was incised proximally and distally until the median nerve was completely decompressed.  Wounds and copiously irrigated meticulous hemostasis was maintained interrupted vertical mattress suture was then used to close the skin the hand was dressed with Adaptic fluffs Curlex roll and strap and patient was sent to recovery in stable vision.

## 2021-09-13 ENCOUNTER — Encounter (HOSPITAL_COMMUNITY): Payer: Self-pay | Admitting: Neurosurgery

## 2022-04-03 IMAGING — RF DG CERVICAL SPINE 1V
1 series · 2 of 2 positions shown · non-contrast
Comparison: Cervical spine MRI 04/20/2020

FLUOROSCOPY TIME:  Fluoroscopy time: 7 seconds

Number of provided images: 2

CLINICAL DATA: Cervical spine surgery.

EXAM:
DG CERVICAL SPINE - 1 VIEW

[Series 1: run · 2 of 2 slices shown]
[im 1/2]
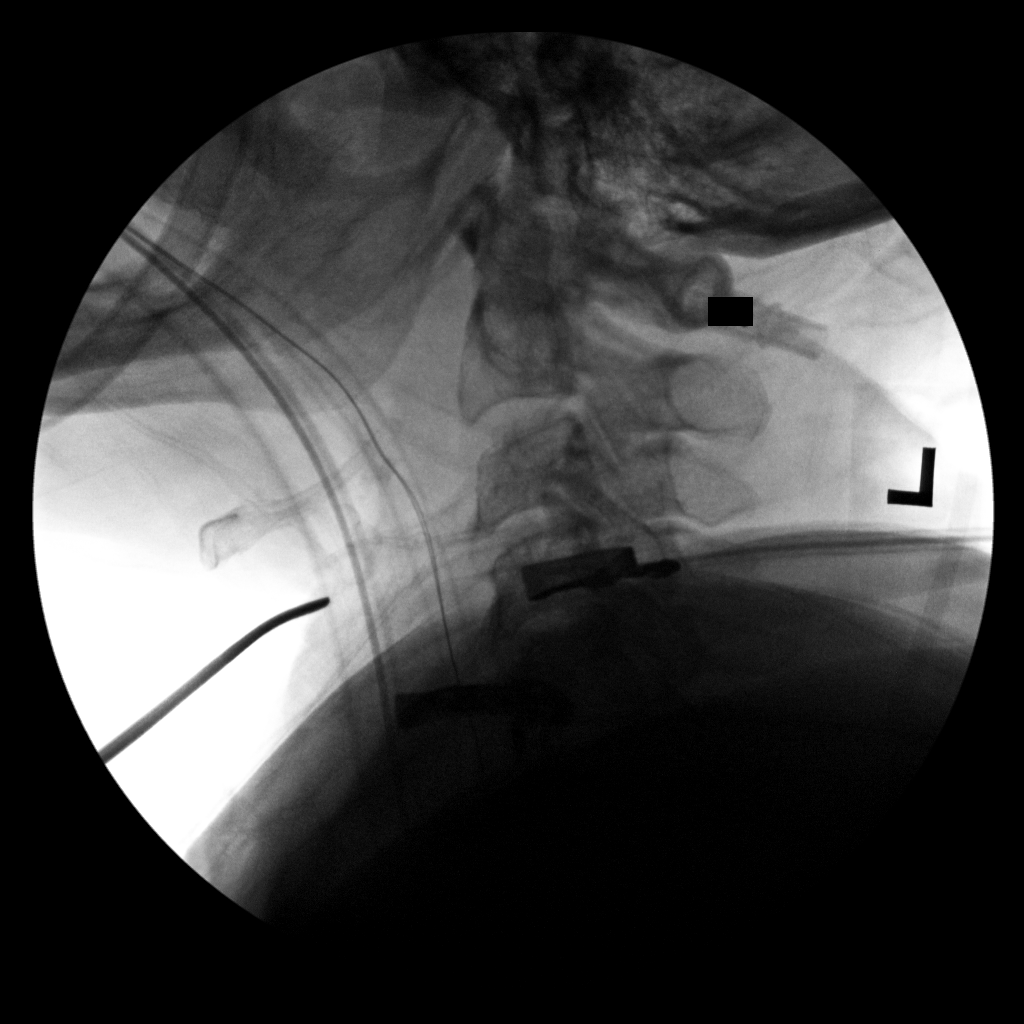
[im 2/2]
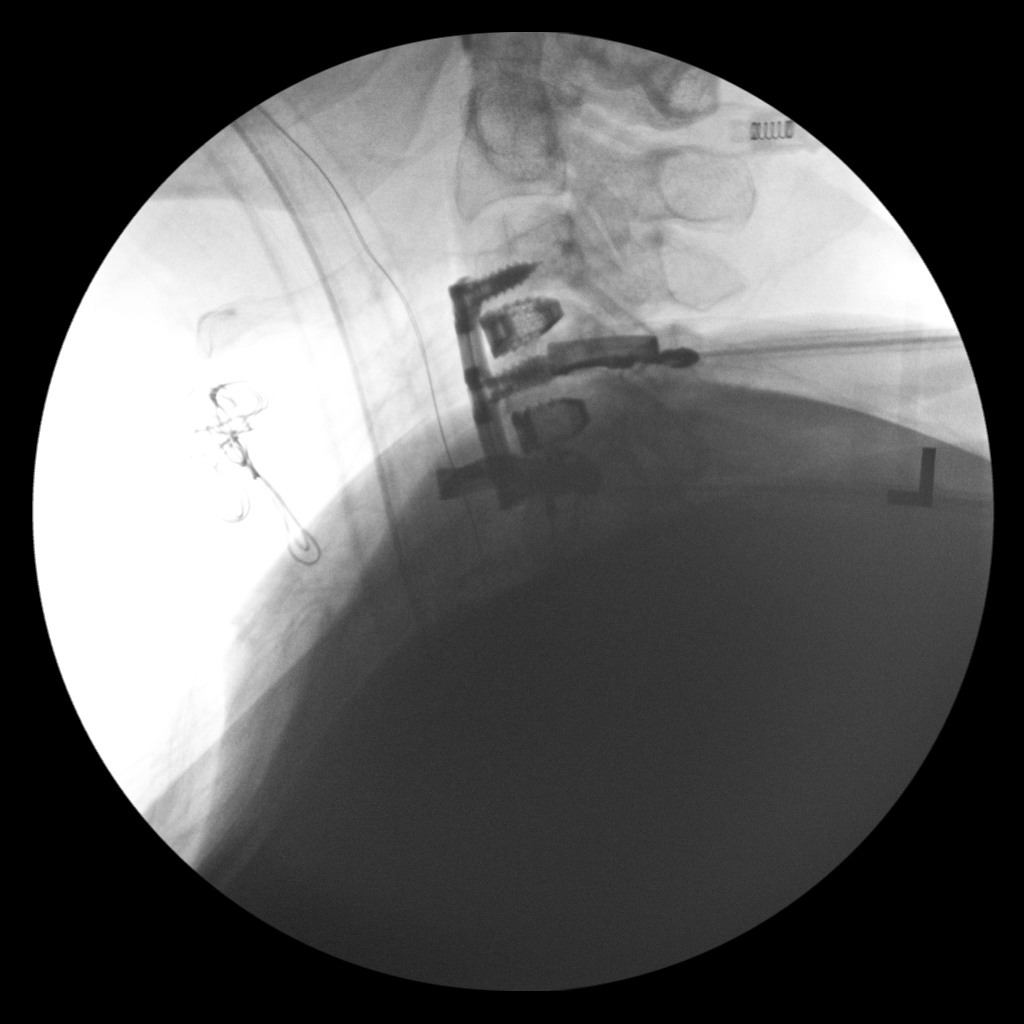

[2 of 2 positions shown; findings below may reference images not displayed]

FINDINGS: Two lateral intraoperative fluoroscopic images of the cervical spine
are provided. On the first image, the tip of a surgical instrument
projects over the anterior neck soft tissues at the C3-4 disc space
level. On the second image, an anterior fusion plate and screws have
been placed from C3-C5, and interbody implants are present at C3-4
and C4-5.
IMPRESSION: Intraoperative images during C3-C5 ACDF.

## 2022-04-30 IMAGING — MR MR LUMBAR SPINE W/O CM
5 series · 30 of 48 positions shown · non-contrast
Comparison: 03/12/2018

CLINICAL DATA: Chronic low back pain.  Claustrophobia.

EXAM:
MRI LUMBAR SPINE WITHOUT CONTRAST
TECHNIQUE: Multiplanar, multisequence MR imaging of the lumbar spine was
performed. No intravenous contrast was administered.

[Series 5: T2 · sagittal · 4.0mm · 0.68mm/px · 6 of 15 slices shown (1 of 2)]
[im 1/15]
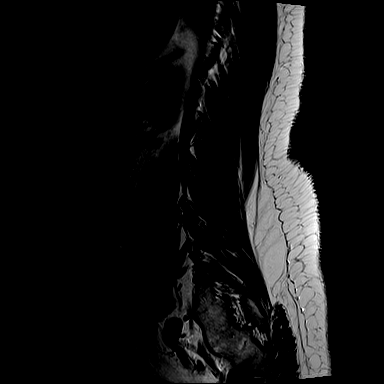
[im 3/15]
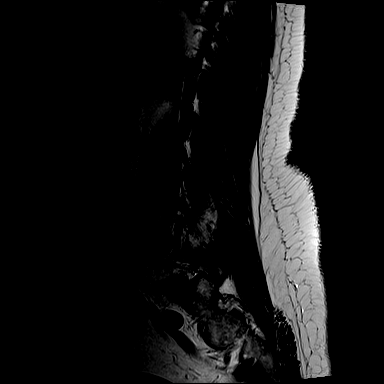
[im 6/15]
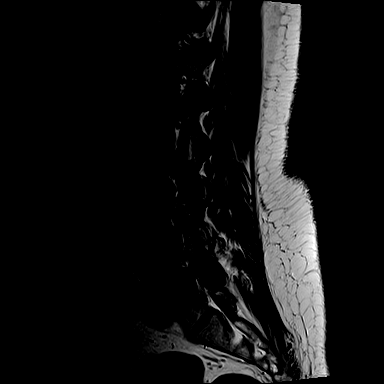
[im 9/15]
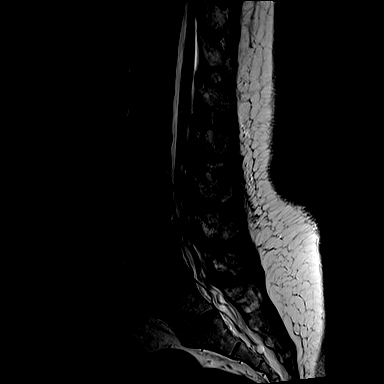
[im 12/15]
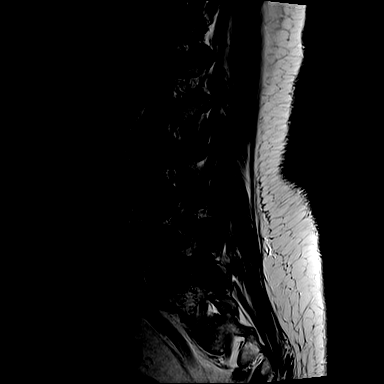
[im 15/15]
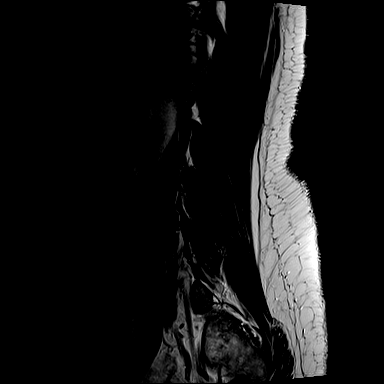

[Series 6: T1 · sagittal · 4.0mm · 0.81mm/px · 7 of 15 slices shown (1 of 2)]
[im 1/15]
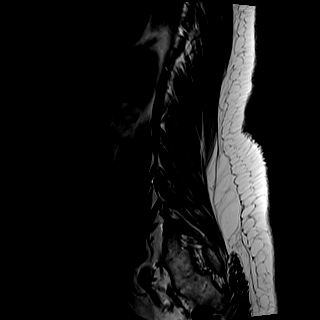
[im 3/15]
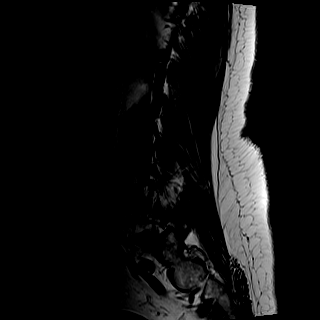
[im 5/15]
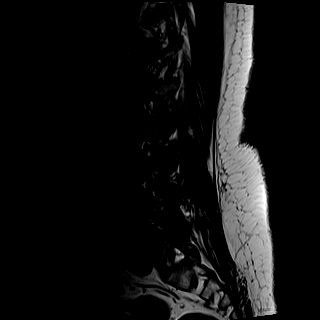
[im 8/15]
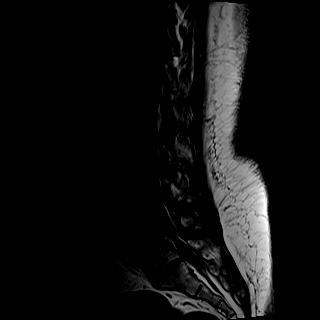
[im 10/15]
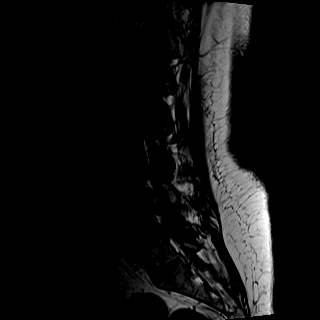
[im 12/15]
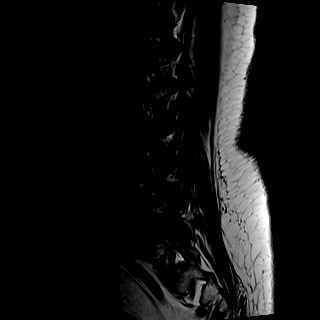
[im 15/15]
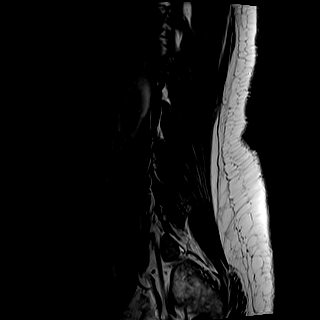

[Series 7: STIR · sagittal · 4.0mm · 0.51mm/px · 1 of 15 slices shown]
[im 1/15]
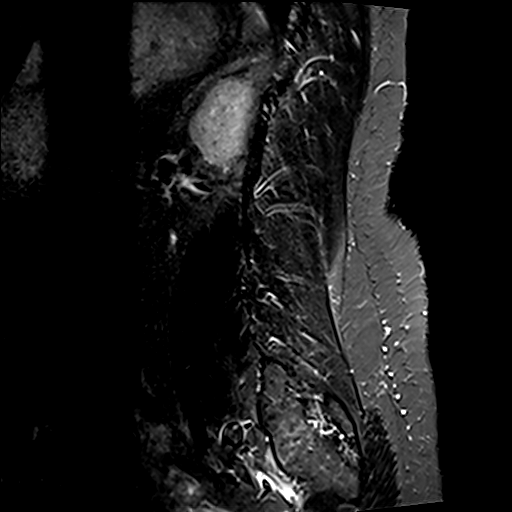

[Series 8: T2 · axial · 4.0mm · 0.70mm/px · z∈[-136,+37]mm · 8 of 30 slices shown (2 of 2)]
[im 1/30]
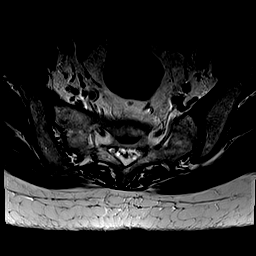
[im 5/30]
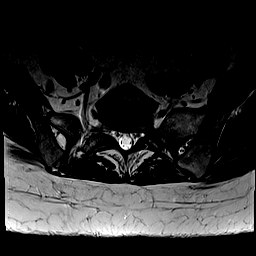
[im 9/30]
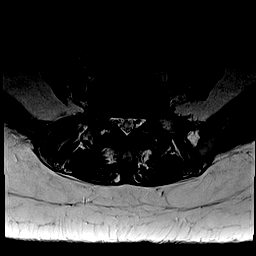
[im 14/30]
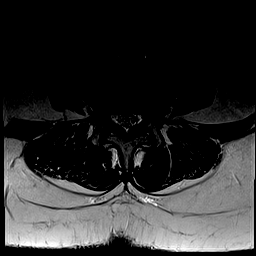
[im 16/30]
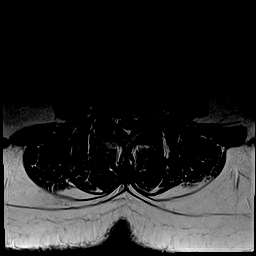
[im 21/30]
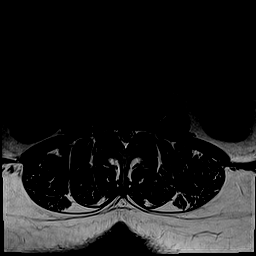
[im 25/30]
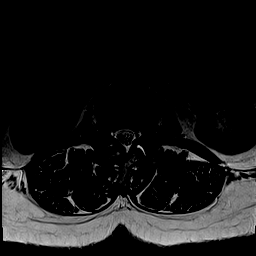
[im 30/30]
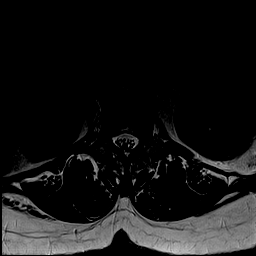

[Series 9: T1 · axial · 4.0mm · 0.35mm/px · z∈[-136,+37]mm · 8 of 30 slices shown (2 of 2)]
[im 1/30]
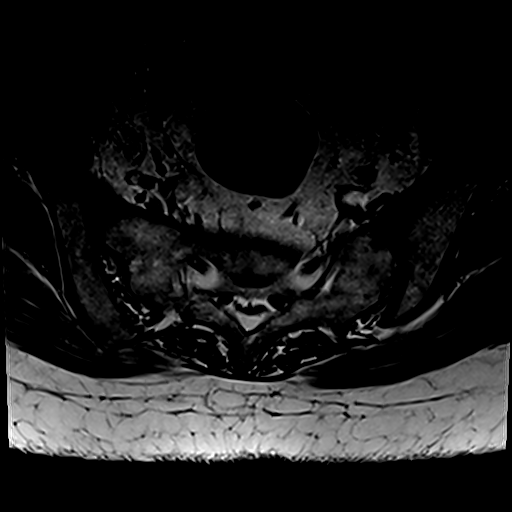
[im 5/30]
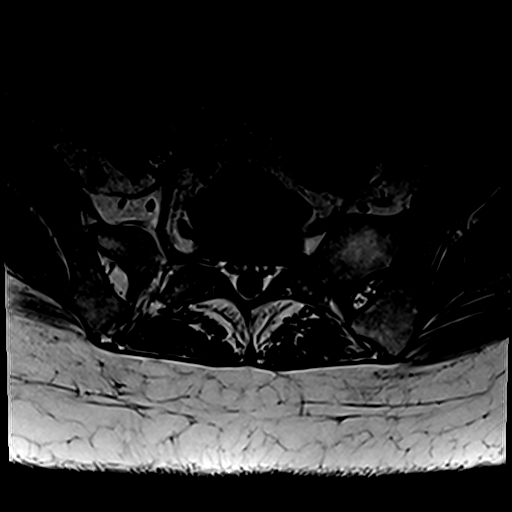
[im 9/30]
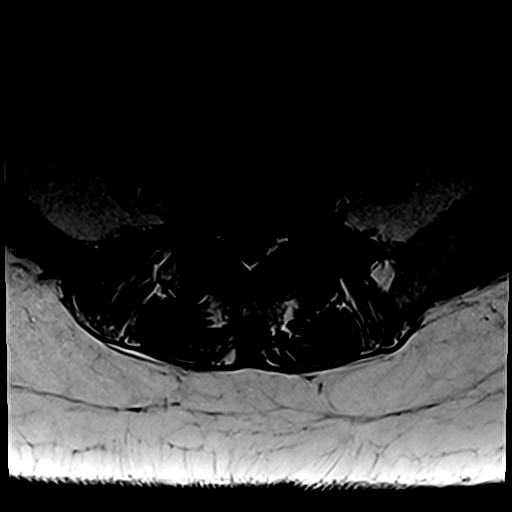
[im 14/30]
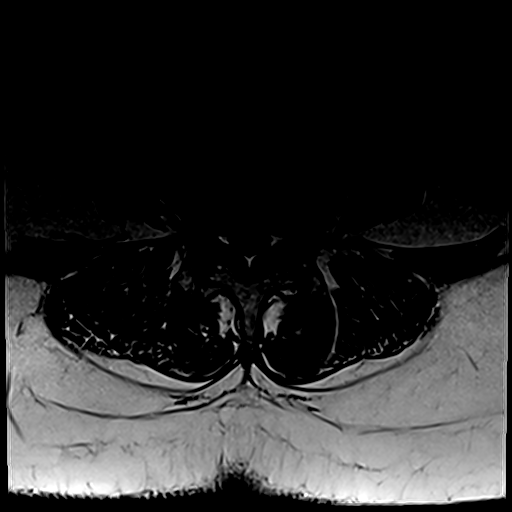
[im 16/30]
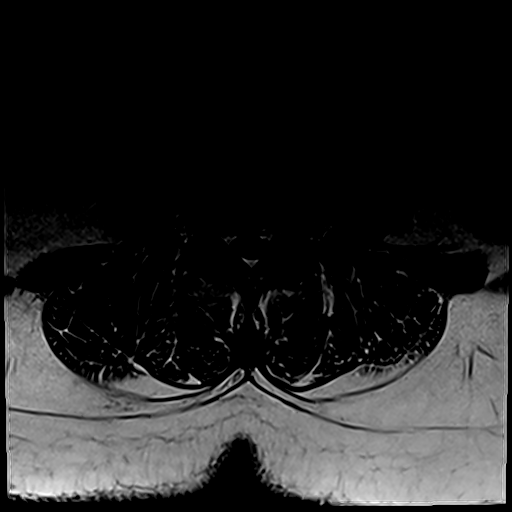
[im 21/30]
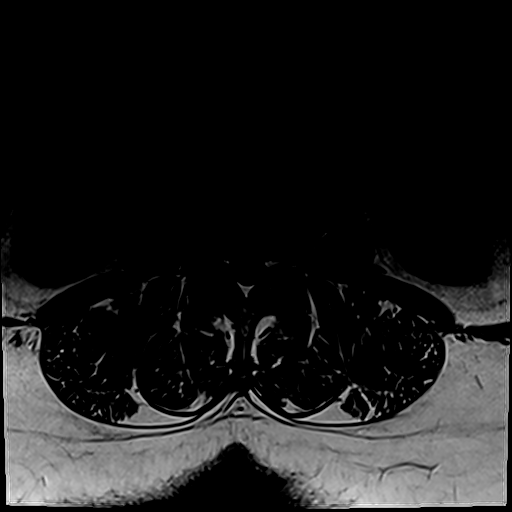
[im 25/30]
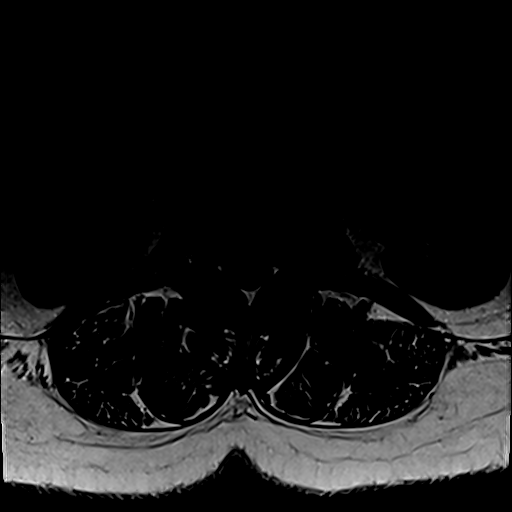
[im 30/30]
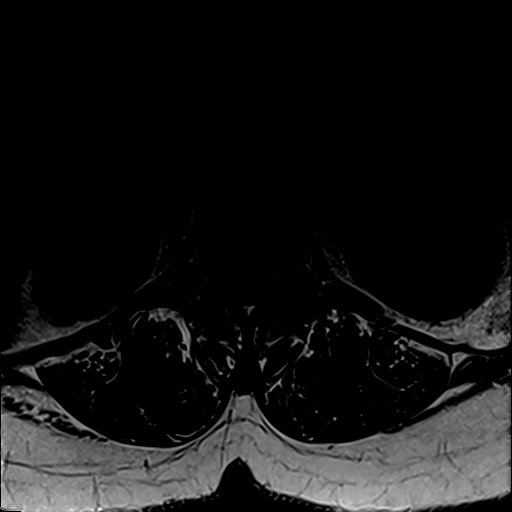

[30 of 48 positions shown; findings below may reference images not displayed]

FINDINGS: Segmentation:  Standard.

Alignment:  Physiologic.

Vertebrae: No acute fracture, evidence of discitis, or aggressive
bone lesion. L5 vertebral body hemangioma.

Conus medullaris and cauda equina: Conus extends to the T12-L1
level. Conus and cauda equina appear normal.

Paraspinal and other soft tissues: No acute paraspinal abnormality.

Disc levels:

Disc spaces: Disc desiccation at T12-L1, L2-3, L3-4 and L4-5. Disc
height loss at L3-4.

T12-L1: No significant disc bulge. No neural foraminal stenosis. No
central canal stenosis.

L1-L2: No significant disc bulge. No neural foraminal stenosis. No
central canal stenosis. Moderate bilateral facet arthropathy with
bilateral facet effusions.

L2-L3: Mild broad-based disc bulge with a broad shallow right
foraminal disc protrusion. Moderate bilateral facet arthropathy.
Mild spinal stenosis. No significant foraminal stenosis.

L3-L4: Broad-based disc bulge. Moderate bilateral facet arthropathy.
Moderate-severe spinal stenosis. Bilateral mild foraminal stenosis.
Bilateral subarticular recess stenosis.

L4-L5: Broad-based disc bulge with a right foraminal disc
protrusion. Severe bilateral facet arthropathy with bilateral facet
effusions. Bilateral subarticular recess stenosis. Severe right
foraminal stenosis. No left foraminal stenosis.

L5-S1: No significant disc bulge. No neural foraminal stenosis. No
central canal stenosis. Mild bilateral facet arthropathy.
IMPRESSION: 1. At L2-3 there is a mild broad-based disc bulge with a broad
shallow right foraminal disc protrusion. Moderate bilateral facet
arthropathy. Mild spinal stenosis.
2. At L4-5 there is a broad-based disc bulge with a right foraminal
disc protrusion. Severe bilateral facet arthropathy with bilateral
facet effusions. Bilateral subarticular recess stenosis. Severe
right foraminal stenosis.
3. At L3-4 there is a broad-based disc bulge. Moderate bilateral
facet arthropathy. Moderate-severe spinal stenosis. Bilateral mild
foraminal stenosis. Bilateral subarticular recess stenosis.

## 2022-05-03 ENCOUNTER — Ambulatory Visit (HOSPITAL_COMMUNITY): Payer: Medicare Other | Admitting: Physical Therapy

## 2022-05-17 ENCOUNTER — Ambulatory Visit (HOSPITAL_COMMUNITY): Payer: Medicare Other | Admitting: Physical Therapy

## 2022-06-09 ENCOUNTER — Ambulatory Visit (HOSPITAL_COMMUNITY): Payer: Medicare Other

## 2022-07-04 ENCOUNTER — Other Ambulatory Visit (HOSPITAL_COMMUNITY): Payer: Self-pay | Admitting: Neurosurgery

## 2022-07-04 DIAGNOSIS — M48062 Spinal stenosis, lumbar region with neurogenic claudication: Secondary | ICD-10-CM

## 2022-08-01 ENCOUNTER — Ambulatory Visit (HOSPITAL_COMMUNITY)
Admission: RE | Admit: 2022-08-01 | Discharge: 2022-08-01 | Disposition: A | Discharge status: Home / Self Care | Payer: Medicare Other | Source: Ambulatory Visit | Attending: Neurosurgery | Admitting: Neurosurgery

## 2022-08-01 DIAGNOSIS — M48062 Spinal stenosis, lumbar region with neurogenic claudication: Secondary | ICD-10-CM | POA: Insufficient documentation

## 2023-10-28 ENCOUNTER — Encounter (HOSPITAL_COMMUNITY): Payer: Self-pay

## 2023-10-28 ENCOUNTER — Emergency Department (HOSPITAL_COMMUNITY)
Admission: EM | Admit: 2023-10-28 | Discharge: 2023-10-28 | Discharge status: Against Medical Advice | Attending: Emergency Medicine | Admitting: Emergency Medicine

## 2023-10-28 ENCOUNTER — Other Ambulatory Visit: Payer: Self-pay

## 2023-10-28 DIAGNOSIS — R220 Localized swelling, mass and lump, head: Secondary | ICD-10-CM | POA: Insufficient documentation

## 2023-10-28 DIAGNOSIS — Z5321 Procedure and treatment not carried out due to patient leaving prior to being seen by health care provider: Secondary | ICD-10-CM | POA: Insufficient documentation

## 2023-10-28 NOTE — ED Notes (Signed)
 Pt called 3 separate times with no answer- pt not in lobby

## 2023-10-28 NOTE — ED Triage Notes (Signed)
 Pt to ED from home with c/o bump/mass on top of head, pt says it has been there for years, but says it is getting bigger and hurts.

## 2024-06-11 ENCOUNTER — Ambulatory Visit: Admitting: Physician Assistant
